# Patient Record
Sex: Female | Born: 1995 | Race: Black or African American | Hispanic: No | Marital: Single | Smoking: Former smoker
Health system: Southern US, Community
[De-identification: ages and names within clinical notes are randomized; demographics above are authoritative.]

## PROBLEM LIST (undated history)

## (undated) ENCOUNTER — Inpatient Hospital Stay (HOSPITAL_COMMUNITY): Payer: Self-pay

## (undated) DIAGNOSIS — Z789 Other specified health status: Secondary | ICD-10-CM

## (undated) DIAGNOSIS — J302 Other seasonal allergic rhinitis: Secondary | ICD-10-CM

## (undated) HISTORY — PX: NO PAST SURGERIES: SHX2092

---
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
## 2017-12-27 ENCOUNTER — Emergency Department (HOSPITAL_COMMUNITY)
Admission: EM | Admit: 2017-12-27 | Discharge: 2017-12-27 | Disposition: A | Discharge status: Home / Self Care | Payer: Medicaid Other | Attending: Emergency Medicine | Admitting: Emergency Medicine

## 2017-12-27 ENCOUNTER — Encounter (HOSPITAL_COMMUNITY): Payer: Self-pay | Admitting: Emergency Medicine

## 2017-12-27 ENCOUNTER — Other Ambulatory Visit: Payer: Self-pay

## 2017-12-27 DIAGNOSIS — Z711 Person with feared health complaint in whom no diagnosis is made: Secondary | ICD-10-CM

## 2017-12-27 DIAGNOSIS — R300 Dysuria: Secondary | ICD-10-CM | POA: Diagnosis present

## 2017-12-27 DIAGNOSIS — N3000 Acute cystitis without hematuria: Secondary | ICD-10-CM | POA: Diagnosis not present

## 2017-12-27 DIAGNOSIS — Z79899 Other long term (current) drug therapy: Secondary | ICD-10-CM | POA: Insufficient documentation

## 2017-12-27 DIAGNOSIS — Z87891 Personal history of nicotine dependence: Secondary | ICD-10-CM | POA: Insufficient documentation

## 2017-12-27 LAB — URINALYSIS, ROUTINE W REFLEX MICROSCOPIC
Bilirubin Urine: NEGATIVE
GLUCOSE, UA: NEGATIVE mg/dL
KETONES UR: 20 mg/dL — AB
Nitrite: POSITIVE — AB
PROTEIN: NEGATIVE mg/dL
Specific Gravity, Urine: 1.023 (ref 1.005–1.030)
pH: 6 (ref 5.0–8.0)

## 2017-12-27 LAB — COMPREHENSIVE METABOLIC PANEL WITH GFR
ALT: 22 U/L (ref 0–44)
AST: 20 U/L (ref 15–41)
Albumin: 4.2 g/dL (ref 3.5–5.0)
Alkaline Phosphatase: 64 U/L (ref 38–126)
Anion gap: 9 (ref 5–15)
BUN: 9 mg/dL (ref 6–20)
CO2: 23 mmol/L (ref 22–32)
Calcium: 9.4 mg/dL (ref 8.9–10.3)
Chloride: 105 mmol/L (ref 98–111)
Creatinine, Ser: 0.94 mg/dL (ref 0.44–1.00)
GFR calc Af Amer: >60 mL/min
GFR calc non Af Amer: >60 mL/min
Glucose, Bld: 90 mg/dL (ref 70–99)
Potassium: 3.5 mmol/L (ref 3.5–5.1)
Sodium: 137 mmol/L (ref 135–145)
Total Bilirubin: 1 mg/dL (ref 0.3–1.2)
Total Protein: 7.2 g/dL (ref 6.5–8.1)

## 2017-12-27 LAB — WET PREP, GENITAL
Clue Cells Wet Prep HPF POC: NONE SEEN
Sperm: NONE SEEN
Trich, Wet Prep: NONE SEEN
Yeast Wet Prep HPF POC: NONE SEEN

## 2017-12-27 LAB — CBC
HCT: 40 % (ref 36.0–46.0)
Hemoglobin: 12.7 g/dL (ref 12.0–15.0)
MCH: 28.9 pg (ref 26.0–34.0)
MCHC: 31.8 g/dL (ref 30.0–36.0)
MCV: 90.9 fL (ref 80.0–100.0)
Platelets: 219 10*3/uL (ref 150–400)
RBC: 4.4 MIL/uL (ref 3.87–5.11)
RDW: 13.7 % (ref 11.5–15.5)
WBC: 10.6 10*3/uL — ABNORMAL HIGH (ref 4.0–10.5)
nRBC: 0 % (ref 0.0–0.2)

## 2017-12-27 LAB — I-STAT BETA HCG BLOOD, ED (MC, WL, AP ONLY): I-stat hCG, quantitative: <5 m[IU]/mL (ref <5)

## 2017-12-27 LAB — LIPASE, BLOOD: Lipase: 21 U/L (ref 11–51)

## 2017-12-27 MED ORDER — CEFTRIAXONE SODIUM 250 MG IJ SOLR
250.0000 mg | Freq: Once | INTRAMUSCULAR | Status: AC
  Administered 2017-12-27: 250 mg via INTRAMUSCULAR
  Filled 2017-12-27: qty 250

## 2017-12-27 MED ORDER — AZITHROMYCIN 250 MG PO TABS
1000.0000 mg | ORAL_TABLET | Freq: Once | ORAL | Status: AC
  Administered 2017-12-27: 1000 mg via ORAL
  Filled 2017-12-27: qty 4

## 2017-12-27 MED ORDER — ONDANSETRON HCL 4 MG PO TABS: 4.0000 mg | ORAL_TABLET | Freq: Four times a day (QID) | ORAL | 0 refills | Status: DC

## 2017-12-27 MED ORDER — CEPHALEXIN 500 MG PO CAPS: 500.0000 mg | ORAL_CAPSULE | Freq: Two times a day (BID) | ORAL | 0 refills | Status: DC

## 2017-12-27 MED ORDER — CEPHALEXIN 500 MG PO CAPS: 500.0000 mg | ORAL_CAPSULE | Freq: Two times a day (BID) | ORAL | 0 refills | Status: AC

## 2017-12-27 NOTE — ED Notes (Addendum)
Light green, lavender, and dark green labs drawn. Pt instructed to provide urine sample when possible. Given UA cup / verbalized understanding.  Dark green is on rocker in mini lab. The rest are in the "Main Lab" at [facility] nursing station tray awaiting orders.

## 2017-12-27 NOTE — ED Provider Notes (Signed)
[facility]   CSN: [id number] Arrival date & time: 12/27/17  1546     History   Chief Complaint Chief Complaint  Patient presents with  . Abdominal Pain    HPI Julia Park is a 22 y.o. female.  Patient is a 22 year old otherwise healthy female presenting today with 1 week of dysuria, frequency and intermittent lower abdominal discomfort.  She denies any vaginal discharge, itching or burning in the vaginal area.  She is currently sexually active with one partner but has only been with this partner for the last few weeks.  She has not been using any protection.  Prior to that she had another partner approximately 1 month ago.  She has Nexplanon but last period was 3 weeks ago.  She denies any back pain, fever, nausea, vomiting.  She has no abdominal pain at this time.  She states she had some cramping earlier this week but it resolved spontaneously.  The history is provided by the patient.  Abdominal Pain   This is a new problem. Episode onset: 1 week. The problem occurs every several days. The problem has not changed since onset.The pain is associated with an unknown factor. The pain is located in the suprapubic region. The quality of the pain is cramping. The pain is at a severity of 0/10. The patient is experiencing no pain. Associated symptoms include dysuria and frequency. Pertinent negatives include anorexia, fever, diarrhea, nausea, vomiting and hematuria. The symptoms are aggravated by urination. Nothing relieves the symptoms.    Past Medical History:  Diagnosis Date  . Medical history non-contributory   . Seasonal allergies     Patient Active Problem List   Diagnosis Date Noted  . Nexplanon insertion 09/15/2015  . Post term pregnancy 09/13/2015    Past Surgical History:  Procedure Laterality Date  . NO PAST SURGERIES       OB History    Gravida  1   Para  1   Term  1   Preterm  0   AB  0   Living  1       SAB  0   TAB  0   Ectopic  0   Multiple  0   Live Births  1            Home Medications    Prior to Admission medications   Medication Sig Start Date End Date Taking? Authorizing Provider  cyclobenzaprine (FLEXERIL) 5 MG tablet Take 1 tablet (5 mg total) by mouth 3 (three) times daily. 06/28/16   Earley FavorSchulz, Gail, NP  ibuprofen (ADVIL,MOTRIN) 600 MG tablet Take 1 tablet (600 mg total) by mouth every 6 (six) hours. 09/16/15   Montez MoritaLawson, Marie D, CNM  ibuprofen (ADVIL,MOTRIN) 600 MG tablet Take 1 tablet (600 mg total) by mouth every 6 (six) hours as needed. 06/28/16   Earley FavorSchulz, Gail, NP  Prenatal Vit-Fe Fumarate-FA (PRENATAL VITAMINS PLUS) 27-1 MG TABS Take 1 tablet by mouth daily. 01/04/15   Bertram Denvereague Clark, Karen E, PA-C    Family History No family history on file.  Social History Social History   Tobacco Use  . Smoking status: Former Smoker    Packs/day: 0.50    Types: Cigars, Cigarettes    Last attempt to quit: 06/10/2015    Years since quitting: 2.5  . Smokeless tobacco: Never Used  Substance Use Topics  . Alcohol use: No  . Drug use: Yes    Types: Marijuana  Comment: used a few months ago     Allergies   Patient has no known allergies.   Review of Systems Review of Systems  Constitutional: Negative for fever.  Gastrointestinal: Positive for abdominal pain. Negative for anorexia, diarrhea, nausea and vomiting.  Genitourinary: Positive for dysuria and frequency. Negative for hematuria.  All other systems reviewed and are negative.    Physical Exam Updated Vital Signs BP 138/88 (BP Location: Right Arm)   Pulse 84   Temp 98.2 F (36.8 C) (Oral)   Resp 16   SpO2 100%   Physical Exam  Constitutional: She is oriented to person, place, and time. She appears well-developed and well-nourished. No distress.  HENT:  Head: Normocephalic and atraumatic.  Mouth/Throat: Oropharynx is clear and moist.  Eyes: Pupils are equal, round, and reactive to light.  Conjunctivae and EOM are normal.  Neck: Normal range of motion. Neck supple.  Cardiovascular: Normal rate, regular rhythm and intact distal pulses.  No murmur heard. Pulmonary/Chest: Effort normal and breath sounds normal. No respiratory distress. She has no wheezes. She has no rales.  Abdominal: Soft. She exhibits no distension. There is no tenderness. There is no rebound and no guarding.  Genitourinary: Vagina normal and uterus normal. Cervix exhibits no motion tenderness and no discharge. Right adnexum displays no mass and no tenderness. Left adnexum displays no mass and no tenderness. No vaginal discharge found.  Musculoskeletal: Normal range of motion. She exhibits no edema or tenderness.  Neurological: She is alert and oriented to person, place, and time.  Skin: Skin is warm and dry. No rash noted. No erythema.  Psychiatric: She has a normal mood and affect. Her behavior is normal.  Nursing note and vitals reviewed.    ED Treatments / Results  Labs (all labs ordered are listed, but only abnormal results are displayed) Labs Reviewed  CBC - Abnormal; Notable for the following components:      Result Value   WBC 10.6 (*)    All other components within normal limits  WET PREP, GENITAL  LIPASE, BLOOD  COMPREHENSIVE METABOLIC PANEL  URINALYSIS, ROUTINE W REFLEX MICROSCOPIC  HIV ANTIBODY (ROUTINE TESTING W REFLEX)  RPR  I-STAT BETA HCG BLOOD, ED ([facility], [facility], [facility] ONLY)  GC/CHLAMYDIA PROBE AMP ([place]) NOT AT [facility]    EKG None  Radiology No results found.  Procedures Procedures (including critical care time)  Medications Ordered in ED Medications - No data to display   Initial Impression / Assessment and Plan / ED Course  I have reviewed the triage vital signs and the nursing notes.  Pertinent labs & imaging results that were available during my care of the patient were reviewed by me and considered in my medical decision making (see chart for details).     Patient is  a healthy 22 year old female who does not practice safe sex practices presenting with urinary symptoms most likely a UTI.  She has no complicating features concerning for pyelonephritis or kidney stone.  However patient also is requesting to be evaluated for an STI as she has recently started having sex with a new partner and does not use protection.  Exam is within normal limits.  Pelvic exam and wet prep, GC chlamydia pending.  Labs drawn prior to my evaluation which showed a mild leukocytosis of 10,000, negative hCG, lipase and CMP are pending.  UA pending.  7:10 PM Labs without acute findings.  Wet prep and UA pending.  Pt desires STI treatment just in case and  was given rocephin and azithro.  Final Clinical Impressions(s) / ED Diagnoses   Final diagnoses:  None    ED Discharge Orders    None       Gwyneth Sprout, MD 12/27/17 1911

## 2017-12-27 NOTE — Discharge Instructions (Signed)
Thank you for allowing me to care for you today in the Emergency Department.   Take 1 tablet of Keflex 2 times daily for the next 5 days.  Do not stop taking this antibiotic even if your symptoms resolve before this time.  Your urine has been sent for culture to the lab.  You can take 1 tablet of Zofran every 6 hours as needed for nausea and vomiting.  You can take 650 mg of Tylenol every 6 hours or 600 mg of ibuprofen with food every 6 hours for pain.  You can always follow up with the health department if you ever need an STD check if you are not having symptoms.  Return to the emergency department if you develop high fever, uncontrollable abdominal pain, significant blood in your urine especially if you are taking antibiotics, uncontrollable vomiting, or other new, concerning symptoms.

## 2017-12-27 NOTE — ED Notes (Signed)
Per lab urine needs to be recollected due to label issue. RN notified.

## 2017-12-27 NOTE — ED Triage Notes (Signed)
Lower abd pain x 1 week states her urine smell s funny and she is irritated when she pees

## 2017-12-27 NOTE — ED Notes (Signed)
Pt stable, ambulatory, states understanding of discharge instructions 

## 2017-12-27 NOTE — ED Provider Notes (Signed)
22 year old Julia Park received a signout from Dr. Anitra LauthPlunkett pending urinalysis.  Per her HPI:  "HPI Julia Julia Park is a 22 y.o. Julia Park.  Patient is a 22 year old otherwise healthy Julia Park presenting today with 1 week of dysuria, frequency and intermittent lower abdominal discomfort.  She denies any vaginal discharge, itching or burning in the vaginal area.  She is currently sexually active with one partner but has only been with this partner for the last few weeks.  She has not been using any protection.  Prior to that she had another partner approximately 1 month ago.  She has Nexplanon but last period was 3 weeks ago.  She denies any back pain, fever, nausea, vomiting.  She has no abdominal pain at this time.  She states she had some cramping earlier this week but it resolved spontaneously.  The history is provided by the patient.  Abdominal Pain   This is a new problem. Episode onset: 1 week. The problem occurs every several days. The problem has not changed since onset.The pain is associated with an unknown factor. The pain is located in the suprapubic region. The quality of the pain is cramping. The pain is at a severity of 0/10. The patient is experiencing no pain. Associated symptoms include dysuria and frequency. Pertinent negatives include anorexia, fever, diarrhea, nausea, vomiting and hematuria. The symptoms are aggravated by urination. Nothing relieves the symptoms. "  Physical Exam  BP 131/85   Pulse 80   Temp 98.4 F (36.9 C) (Oral)   Resp 17   SpO2 100%   Physical Exam  Minimally tender to palpation in the bilateral lower quadrants.  No rebound or guarding.  Abdomen is soft, nondistended.  Normoactive bowel sounds.  ED Course/Procedures     Procedures  MDM   22 year old Julia Park received a signout from Dr. Anitra LauthPlunkett pending urinalysis results.  She has been treated with Rocephin and azithromycin due to concern for STD exposure.  UA is concerning for infection.  Urine culture has  been sent.  No previous cultures available for comparison.  We will discharge the patient to home with Zofran for nausea and Keflex for UTI.  Recommended follow-up with PCP for recheck this week.  She is hemodynamically stable and in no acute distress.  She is safe for discharge to home with outpatient follow-up at this time.    Frederik PearMcDonald, Jaretzy Lhommedieu A, PA-C 12/27/17 2141    Gwyneth SproutPlunkett, Whitney, MD 12/28/17 2100

## 2017-12-28 LAB — HIV ANTIBODY (ROUTINE TESTING W REFLEX): HIV Screen 4th Generation wRfx: NONREACTIVE

## 2017-12-28 LAB — GC/CHLAMYDIA PROBE AMP (~~LOC~~) NOT AT ARMC
CHLAMYDIA, DNA PROBE: NEGATIVE
Neisseria Gonorrhea: NEGATIVE

## 2017-12-28 LAB — RPR: RPR: NONREACTIVE

## 2018-01-18 ENCOUNTER — Emergency Department (HOSPITAL_COMMUNITY)
Admission: EM | Admit: 2018-01-18 | Discharge: 2018-01-18 | Disposition: A | Discharge status: Home / Self Care | Payer: Medicaid Other | Attending: Emergency Medicine | Admitting: Emergency Medicine

## 2018-01-18 ENCOUNTER — Emergency Department (HOSPITAL_COMMUNITY): Payer: Medicaid Other

## 2018-01-18 ENCOUNTER — Encounter (HOSPITAL_COMMUNITY): Payer: Self-pay | Admitting: Pharmacy Technician

## 2018-01-18 ENCOUNTER — Other Ambulatory Visit: Payer: Self-pay

## 2018-01-18 DIAGNOSIS — Y929 Unspecified place or not applicable: Secondary | ICD-10-CM | POA: Diagnosis not present

## 2018-01-18 DIAGNOSIS — S62316A Displaced fracture of base of fifth metacarpal bone, right hand, initial encounter for closed fracture: Secondary | ICD-10-CM | POA: Diagnosis not present

## 2018-01-18 DIAGNOSIS — W2209XA Striking against other stationary object, initial encounter: Secondary | ICD-10-CM | POA: Insufficient documentation

## 2018-01-18 DIAGNOSIS — Y999 Unspecified external cause status: Secondary | ICD-10-CM | POA: Insufficient documentation

## 2018-01-18 DIAGNOSIS — Z87891 Personal history of nicotine dependence: Secondary | ICD-10-CM | POA: Diagnosis not present

## 2018-01-18 DIAGNOSIS — Y939 Activity, unspecified: Secondary | ICD-10-CM | POA: Diagnosis not present

## 2018-01-18 DIAGNOSIS — S6991XA Unspecified injury of right wrist, hand and finger(s), initial encounter: Secondary | ICD-10-CM | POA: Diagnosis present

## 2018-01-18 NOTE — ED Provider Notes (Signed)
[facility] Provider Note   CSN: [id number] Arrival date & time: 01/18/18  2013     History   Chief Complaint Chief Complaint  Patient presents with  . Hand Injury    HPI Julia Park is a 22 y.o. female.  HPI  Patient is a [age] year old female with a history of seasonal allergies who presents emergency department today for evaluation of right hand pain that began several days ago after she punched a wall.  Patient states that time she had sudden onset of pain to the right hand.  States she has tried ice and elevation as well as over-the-counter medications with no significant relief at home.  Reports swelling to the area and numbness as well.  Has decreased range of motion secondary to pain.  Describes pain as aching pain.  Estimated by movement.  Past Medical History:  Diagnosis Date  . Medical history non-contributory   . Seasonal allergies     Patient Active Problem List   Diagnosis Date Noted  . Nexplanon insertion 09/15/2015  . Post term pregnancy 09/13/2015    Past Surgical History:  Procedure Laterality Date  . NO PAST SURGERIES       OB History    Gravida  1   Para  1   Term  1   Preterm  0   AB  0   Living  1     SAB  0   TAB  0   Ectopic  0   Multiple  0   Live Births  1            Home Medications    Prior to Admission medications   Medication Sig Start Date End Date Taking? Authorizing Provider  acetaminophen (TYLENOL) 500 MG tablet Take 500-1,000 mg by mouth every 6 (six) hours as needed (for pain).   Yes [provider]  ibuprofen (ADVIL,MOTRIN) 200 MG tablet Take 200-600 mg by mouth every 6 (six) hours as needed (for pain).   Yes [provider]  cyclobenzaprine (FLEXERIL) 5 MG tablet Take 1 tablet (5 mg total) by mouth 3 (three) times daily. Patient not taking: Reported on 01/18/2018 06/28/16   Earley FavorSchulz, Gail, NP  ibuprofen (ADVIL,MOTRIN) 600 MG tablet Take 1 tablet (600  mg total) by mouth every 6 (six) hours. Patient not taking: Reported on 01/18/2018 09/16/15   Montez MoritaLawson, Marie D, CNM  ibuprofen (ADVIL,MOTRIN) 600 MG tablet Take 1 tablet (600 mg total) by mouth every 6 (six) hours as needed. Patient not taking: Reported on 01/18/2018 06/28/16   Earley FavorSchulz, Gail, NP  ondansetron (ZOFRAN) 4 MG tablet Take 1 tablet (4 mg total) by mouth every 6 (six) hours. Patient not taking: Reported on 01/18/2018 12/27/17   McDonald, Pedro EarlsMia A, PA-C  Prenatal Vit-Fe Fumarate-FA (PRENATAL VITAMINS PLUS) 27-1 MG TABS Take 1 tablet by mouth daily. Patient not taking: Reported on 01/18/2018 01/04/15   Glyn Adeeague Clark, Scot JunKaren E, PA-C    Family History No family history on file.  Social History Social History   Tobacco Use  . Smoking status: Former Smoker    Packs/day: 0.50    Types: Cigars, Cigarettes    Last attempt to quit: 06/10/2015    Years since quitting: 2.6  . Smokeless tobacco: Never Used  Substance Use Topics  . Alcohol use: No  . Drug use: Yes    Types: Marijuana    Comment: used a few months ago     Allergies   Patient has  no known allergies.   Review of Systems Review of Systems  Constitutional: Negative for fever.  Genitourinary: Negative for pelvic pain.  Musculoskeletal:       Right hand pain  Skin: Negative for wound.  Neurological: Positive for numbness.     Physical Exam Updated Vital Signs BP (!) 144/128 (BP Location: Left Arm)   Pulse 95   Temp 99.3 F (37.4 C) (Oral)   Resp 16   Ht 5\' 2"  (1.575 m)   Wt 74.8 kg   LMP 01/08/2018   SpO2 100%   BMI 30.18 kg/m   Physical Exam Vitals signs and nursing note reviewed.  Constitutional:      General: She is not in acute distress.    Appearance: She is well-developed.  HENT:     Head: Normocephalic and atraumatic.  Eyes:     Conjunctiva/sclera: Conjunctivae normal.  Neck:     Musculoskeletal: Neck supple.  Cardiovascular:     Rate and Rhythm: Normal rate.  Pulmonary:     Effort: Pulmonary  effort is normal.  Musculoskeletal:     Comments: Tenderness and swelling over the right fourth and fifth metacarpals.  Decreased grip strength on the right secondary to pain.  Neurovascularly intact distally.  No tenderness to the radius, ulna or carpal bones.  Full range of motion at the wrist and elbow.  Skin:    General: Skin is warm and dry.  Neurological:     Mental Status: She is alert.      ED Treatments / Results  Labs (all labs ordered are listed, but only abnormal results are displayed) Labs Reviewed - No data to display  EKG None  Radiology Dg Hand Complete Right  Result Date: 01/18/2018 CLINICAL DATA:  Punched wall 3 days ago. Persistent right hand pain and swelling. Initial encounter. EXAM: RIGHT HAND - COMPLETE 3+ VIEW COMPARISON:  None. FINDINGS: A comminuted, mildly displaced fracture seen involving base 5th metacarpal. No other fractures or bone lesions identified. IMPRESSION: Mildly displaced and comminuted fracture of the 5th metacarpal base. Electronically Signed   By: Myles Rosenthal M.D.   On: 01/18/2018 21:33    Procedures Procedures (including critical care time) SPLINT APPLICATION Date/Time: 10:06 PM Authorized by: Karrie Meres Consent: Verbal consent obtained. Risks and benefits: risks, benefits and alternatives were discussed Consent given by: patient Splint applied by: orthopedic technician Location details: RUE Splint type: dorsal and volar forearm splint Post-procedure: The splinted body part was neurovascularly unchanged following the procedure. Patient tolerance: Patient tolerated the procedure well with no immediate complications.   Medications Ordered in ED Medications - No data to display   Initial Impression / Assessment and Plan / ED Course  I have reviewed the triage vital signs and the nursing notes.  Pertinent labs & imaging results that were available during my care of the patient were reviewed by me and considered in my  medical decision making (see chart for details).     Final Clinical Impressions(s) / ED Diagnoses   Final diagnoses:  Displaced fracture of base of fifth metacarpal bone, right hand, initial encounter for closed fracture   Patient presenting with right hand pain after punching a wall several days ago.  X-ray of the right hand shows Mildly displaced and comminuted fracture of the 5th metacarpal base. Splint applied in the ED. follow-up with hand surgery given.  Advised Tylenol and Motrin for symptoms.  Advised elevation.  Advised to return to the ER for new or worsening symptoms.  She voiced understanding of the plan and reasons return.  All questions answered.  ED Discharge Orders    None       Rayne DuCouture, Shanquita Ronning S, PA-C 01/18/18 2206    Tegeler, Canary Brimhristopher J, MD 01/19/18 [phone]

## 2018-01-18 NOTE — Progress Notes (Signed)
Orthopedic Tech Progress Note Patient Details:  Julia Park 02/05/1995 [id number]  Ortho Devices Type of Ortho Device: Short arm splint Ortho Device/Splint Interventions: Ordered, Application   Post Interventions Patient Tolerated: Well Instructions Provided: Care of device, Adjustment of device   Norva KarvonenWalls, Julia Park 01/18/2018, 10:07 PM

## 2018-01-18 NOTE — ED Notes (Signed)
Patient verbalizes understanding of discharge instructions. Opportunity for questioning and answers were provided. 

## 2018-01-18 NOTE — ED Notes (Signed)
Pt returned from xray

## 2018-01-18 NOTE — Discharge Instructions (Addendum)
You may alternate taking Tylenol and Ibuprofen as needed for pain control. You may take 400-600 mg of ibuprofen every 6 hours and [id number] mg of Tylenol every 6 hours. Do not exceed 4000 mg of Tylenol daily as this can lead to liver damage. Also, make sure to take Ibuprofen with meals as it can cause an upset stomach. Do not take other NSAIDs while taking Ibuprofen such as (Aleve, Naprosyn, Aspirin, Celebrex, etc) and do not take more than the prescribed dose as this can lead to ulcers and bleeding in your GI tract. You may use warm and cold compresses to help with your symptoms.   Please wear the splint at all times and follow the instructions on your discharge paperwork in regards to caring for your splint.  Please follow up with your primary doctor within the next 7-10 days for re-evaluation and further treatment of your symptoms.   Please return to the ER sooner if you have any new or worsening symptoms.

## 2018-01-18 NOTE — ED Triage Notes (Signed)
Pt presents to the ED with R hand pain and bruising after punching a wall 2 days ago. Reports along the outward edge of the 5th digit there is numbness.

## 2018-10-04 ENCOUNTER — Other Ambulatory Visit: Payer: Self-pay

## 2018-10-04 ENCOUNTER — Encounter (HOSPITAL_COMMUNITY): Payer: Self-pay | Admitting: Emergency Medicine

## 2018-10-04 ENCOUNTER — Emergency Department (HOSPITAL_COMMUNITY)
Admission: EM | Admit: 2018-10-04 | Discharge: 2018-10-04 | Disposition: A | Discharge status: Home / Self Care | Payer: Medicaid Other | Attending: Emergency Medicine | Admitting: Emergency Medicine

## 2018-10-04 DIAGNOSIS — Z113 Encounter for screening for infections with a predominantly sexual mode of transmission: Secondary | ICD-10-CM | POA: Diagnosis not present

## 2018-10-04 DIAGNOSIS — Z87891 Personal history of nicotine dependence: Secondary | ICD-10-CM | POA: Diagnosis not present

## 2018-10-04 DIAGNOSIS — R197 Diarrhea, unspecified: Secondary | ICD-10-CM | POA: Diagnosis not present

## 2018-10-04 DIAGNOSIS — N898 Other specified noninflammatory disorders of vagina: Secondary | ICD-10-CM | POA: Diagnosis not present

## 2018-10-04 DIAGNOSIS — Z20828 Contact with and (suspected) exposure to other viral communicable diseases: Secondary | ICD-10-CM | POA: Insufficient documentation

## 2018-10-04 DIAGNOSIS — R1031 Right lower quadrant pain: Secondary | ICD-10-CM | POA: Insufficient documentation

## 2018-10-04 DIAGNOSIS — R1032 Left lower quadrant pain: Secondary | ICD-10-CM | POA: Insufficient documentation

## 2018-10-04 DIAGNOSIS — R1030 Lower abdominal pain, unspecified: Secondary | ICD-10-CM

## 2018-10-04 LAB — URINALYSIS, ROUTINE W REFLEX MICROSCOPIC
Bacteria, UA: NONE SEEN
Bilirubin Urine: NEGATIVE
Glucose, UA: NEGATIVE mg/dL
Ketones, ur: 20 mg/dL — AB
Nitrite: NEGATIVE
Protein, ur: NEGATIVE mg/dL
Specific Gravity, Urine: 1.026 (ref 1.005–1.030)
pH: 5 (ref 5.0–8.0)

## 2018-10-04 LAB — CBC
HCT: 36.1 % (ref 36.0–46.0)
Hemoglobin: 11.9 g/dL — ABNORMAL LOW (ref 12.0–15.0)
MCH: 29.8 pg (ref 26.0–34.0)
MCHC: 33 g/dL (ref 30.0–36.0)
MCV: 90.3 fL (ref 80.0–100.0)
Platelets: 196 10*3/uL (ref 150–400)
RBC: 4 MIL/uL (ref 3.87–5.11)
RDW: 13.4 % (ref 11.5–15.5)
WBC: 16.6 10*3/uL — ABNORMAL HIGH (ref 4.0–10.5)
nRBC: 0 % (ref 0.0–0.2)

## 2018-10-04 LAB — WET PREP, GENITAL
Clue Cells Wet Prep HPF POC: NONE SEEN
Sperm: NONE SEEN
Trich, Wet Prep: NONE SEEN
Yeast Wet Prep HPF POC: NONE SEEN

## 2018-10-04 LAB — COMPREHENSIVE METABOLIC PANEL
ALT: 18 U/L (ref 0–44)
AST: 14 U/L — ABNORMAL LOW (ref 15–41)
Albumin: 4.1 g/dL (ref 3.5–5.0)
Alkaline Phosphatase: 57 U/L (ref 38–126)
Anion gap: 9 (ref 5–15)
BUN: 15 mg/dL (ref 6–20)
CO2: 22 mmol/L (ref 22–32)
Calcium: 9.3 mg/dL (ref 8.9–10.3)
Chloride: 106 mmol/L (ref 98–111)
Creatinine, Ser: 0.93 mg/dL (ref 0.44–1.00)
GFR calc Af Amer: >60 mL/min (ref >60)
GFR calc non Af Amer: >60 mL/min (ref >60)
Glucose, Bld: 98 mg/dL (ref 70–99)
Potassium: 3.3 mmol/L — ABNORMAL LOW (ref 3.5–5.1)
Sodium: 137 mmol/L (ref 135–145)
Total Bilirubin: 1.5 mg/dL — ABNORMAL HIGH (ref 0.3–1.2)
Total Protein: 7.3 g/dL (ref 6.5–8.1)

## 2018-10-04 LAB — I-STAT BETA HCG BLOOD, ED (MC, WL, AP ONLY): I-stat hCG, quantitative: <5 m[IU]/mL (ref <5)

## 2018-10-04 LAB — LIPASE, BLOOD: Lipase: 18 U/L (ref 11–51)

## 2018-10-04 MED ORDER — SODIUM CHLORIDE 0.9% FLUSH: 3.0000 mL | Freq: Once | INTRAVENOUS | Status: DC

## 2018-10-04 MED ORDER — KETOROLAC TROMETHAMINE 60 MG/2ML IM SOLN
60.0000 mg | Freq: Once | INTRAMUSCULAR | Status: AC
  Administered 2018-10-04: 60 mg via INTRAMUSCULAR
  Filled 2018-10-04: qty 2

## 2018-10-04 MED ORDER — AZITHROMYCIN 1 G PO PACK
1.0000 g | PACK | Freq: Once | ORAL | Status: AC
  Administered 2018-10-04: 23:00:00 1 g via ORAL
  Filled 2018-10-04: qty 1

## 2018-10-04 MED ORDER — NAPROXEN 500 MG PO TABS: 500.0000 mg | ORAL_TABLET | Freq: Two times a day (BID) | ORAL | 0 refills | Status: DC

## 2018-10-04 MED ORDER — HYDROCODONE-ACETAMINOPHEN 5-325 MG PO TABS
1.0000 | ORAL_TABLET | Freq: Once | ORAL | Status: AC
  Administered 2018-10-04: 1 via ORAL
  Filled 2018-10-04: qty 1

## 2018-10-04 MED ORDER — CEFTRIAXONE SODIUM 250 MG IJ SOLR
250.0000 mg | Freq: Once | INTRAMUSCULAR | Status: AC
  Administered 2018-10-04: 23:00:00 250 mg via INTRAMUSCULAR
  Filled 2018-10-04: qty 250

## 2018-10-04 MED ORDER — STERILE WATER FOR INJECTION IJ SOLN
INTRAMUSCULAR | Status: AC
  Administered 2018-10-04: 23:00:00 0.9 mL
  Filled 2018-10-04: qty 10

## 2018-10-04 NOTE — Discharge Instructions (Signed)
You were seen today for abdominal pain. We think this may be related to a sexually transmitted infection so you were given antibiotics. We do not have your test results back yet to know for sure. We have also tested you for Covid-19. Please make sure to go home and self quarantine until you know your results are negative. Also, since we do not have your results back yet, please return to the ER if you are having andy new or worsening symptoms. Thank you for allowing me to care for you today. Please return to the emergency department if you have new or worsening symptoms. Take your medications as instructed.

## 2018-10-04 NOTE — ED Triage Notes (Signed)
Pt reports lower abdominal pain since yesterday, denies nausea, vomiting, has had diarrhea both days. Denies recent fever. LMP 10/02/2018. VSS. Pt NAD at present.

## 2018-10-04 NOTE — ED Provider Notes (Signed)
[facility] EMERGENCY DEPARTMENT Provider Note   CSN: [id number] Arrival date & time: 10/04/18  1434     History   Chief Complaint Chief Complaint  Patient presents with  . Abdominal Pain    HPI Julia Park is a 23 y.o. female.     HPI  Past Medical History:  Diagnosis Date  . Medical history non-contributory   . Seasonal allergies     Patient Active Problem List   Diagnosis Date Noted  . Nexplanon insertion 09/15/2015  . Post term pregnancy 09/13/2015    Past Surgical History:  Procedure Laterality Date  . NO PAST SURGERIES       OB History    Gravida  1   Para  1   Term  1   Preterm  0   AB  0   Living  1     SAB  0   TAB  0   Ectopic  0   Multiple  0   Live Births  1            Home Medications    Prior to Admission medications   Medication Sig Start Date End Date Taking? Authorizing Provider  naproxen (NAPROSYN) 500 MG tablet Take 1 tablet (500 mg total) by mouth 2 (two) times daily. 10/04/18   Arlyn Dunning PA-C    Family History History reviewed. No pertinent family history.  Social History Social History   Tobacco Use  . Smoking status: Former Smoker    Packs/day: 0.50    Types: Cigars, Cigarettes    Quit date: 06/10/2015    Years since quitting: 3.3  . Smokeless tobacco: Never Used  Substance Use Topics  . Alcohol use: No  . Drug use: Yes    Types: Marijuana    Comment: used a few months ago     Allergies   Patient has no known allergies.   Review of Systems Review of Systems  Constitutional: Positive for appetite change. Negative for chills and fever.  HENT: Negative for congestion and sore throat.   Respiratory: Negative for cough and shortness of breath.   Gastrointestinal: Positive for abdominal pain and diarrhea. Negative for nausea and vomiting.  Genitourinary: Negative for decreased urine volume, difficulty urinating, dysuria, flank pain, hematuria, vaginal bleeding, vaginal  discharge and vaginal pain.  Musculoskeletal: Negative for back pain.  Skin: Negative for rash.  Neurological: Negative for dizziness.     Physical Exam Updated Vital Signs BP (!) 117/59 (BP Location: Right Arm)   Pulse 60   Temp 99.1 F (37.3 C) (Oral)   Resp 12   Ht 5\' 2"  (1.575 m)   Wt 68 kg   LMP 09/30/2018   SpO2 99%   BMI 27.44 kg/m   Physical Exam Vitals signs and nursing note reviewed. Exam conducted with a chaperone present (chaperoned by ER tech).  Constitutional:      General: She is not in acute distress.    Appearance: She is well-developed. She is not ill-appearing, toxic-appearing or diaphoretic.  Cardiovascular:     Rate and Rhythm: Normal rate.  Pulmonary:     Effort: Pulmonary effort is normal.  Abdominal:     General: Abdomen is flat. Bowel sounds are normal.     Palpations: Abdomen is soft.     Tenderness: There is abdominal tenderness in the right lower quadrant, suprapubic area and left lower quadrant. There is no right CVA tenderness or left CVA tenderness.  Genitourinary:  Vagina: Normal.     Cervix: Normal. Discharge: green.     Uterus: Normal. Not tender.      Adnexa: Right adnexa normal and left adnexa normal.       Right: No tenderness.         Left: No tenderness.    Skin:    General: Skin is warm.  Neurological:     Mental Status: She is alert.  Psychiatric:        Mood and Affect: Mood normal.      ED Treatments / Results  Labs (all labs ordered are listed, but only abnormal results are displayed) Labs Reviewed  WET PREP, GENITAL - Abnormal; Notable for the following components:      Result Value   WBC, Wet Prep HPF POC MANY (*)    All other components within normal limits  COMPREHENSIVE METABOLIC PANEL - Abnormal; Notable for the following components:   Potassium 3.3 (*)    AST 14 (*)    Total Bilirubin 1.5 (*)    All other components within normal limits  CBC - Abnormal; Notable for the following components:   WBC  16.6 (*)    Hemoglobin 11.9 (*)    All other components within normal limits  URINALYSIS, ROUTINE W REFLEX MICROSCOPIC - Abnormal; Notable for the following components:   APPearance HAZY (*)    Hgb urine dipstick SMALL (*)    Ketones, ur 20 (*)    Leukocytes,Ua SMALL (*)    All other components within normal limits  NOVEL CORONAVIRUS, NAA (HOSP ORDER, SEND-OUT TO REF LAB; TAT 18-24 HRS)  LIPASE, BLOOD  I-STAT BETA HCG BLOOD, ED ([facility], [facility], [facility] ONLY)  GC/CHLAMYDIA PROBE AMP ([place]) NOT AT [facility]    EKG None  Radiology No results found.  Procedures Procedures (including critical care time)  Medications Ordered in ED Medications  sodium chloride flush (NS) 0.9 % injection 3 mL (3 mLs Intravenous Not Given 10/04/18 2321)  ketorolac (TORADOL) injection 60 mg (60 mg Intramuscular Given 10/04/18 2216)  cefTRIAXone (ROCEPHIN) injection 250 mg (250 mg Intramuscular Given 10/04/18 2320)  azithromycin (ZITHROMAX) powder 1 g (1 g Oral Given 10/04/18 2320)  sterile water (preservative free) injection (0.9 mLs  Given 10/04/18 2321)  HYDROcodone-acetaminophen (NORCO/VICODIN) 5-325 MG per tablet 1 tablet (1 tablet Oral Given 10/04/18 2324)     Initial Impression / Assessment and Plan / ED Course  I have reviewed the triage vital signs and the nursing notes.  Pertinent labs & imaging results that were available during my care of the patient were reviewed by me and considered in my medical decision making (see chart for details).  Clinical Course as of Oct 04 2331  Tue Oct 04, 2018  2230 Patient was seen and examined by myself in addition to the PA.  Per my history, briefly, the patient is a 23 year old female with no significant past medical history presents emergency department with abdominal pain.  She reports symptom onset gradually yesterday while at work.  She describes it as a with a pressure and a sharp pain in her suprapubic region.  The pain went away completely and then returned insidiously  again today while she was at work.  She currently has mild pain.  She reports she has felt similar symptoms in the past when she was diagnosed with gonorrhea and like to get tested again today.  She reports she is sexually active with her boyfriend and they intermittently use barrier protection.  She denies  any vaginal bleeding or discharge.  She reports her menses ended yesterday.  She denies an abdominal surgical history.  She denies any nausea or vomiting.  She reports some diarrhea.  She also reports some dysuria.  On exam she is well-appearing and comfortable.  On abdominal exam she demonstrates some mild to moderate tenderness in the left lower quadrant suprapubic region.  She has no focal tenderness McBurney's point.  She has a negative psoas and Rovsing sign.  She has a negative Murphy sign.   [MT]  2325 Patient is feeling better. Discussed lab findings with patient and family member at bedside. Disucssed treating her for gonorrhea/chlamydia due to pelvic pain with vaginal discharge. Also discussed cannot r/o appendicitis or other intraabdominal process given her fever and white count. However, since she clinically appears well and would like to go home, I will d/c her with very strict return precautions. Patient has access to mychart and will check her results from there. Advised that she should be feeling better by tomorrow if this is related to gc/chlamydia and that she needs to return to the ER for any worsening or new symptoms.    [KM]    Clinical Course User Index [KM] Alveria Apley, PA-C [MT] Langston Masker Carola Rhine, MD       Counseled pt on good return precautions and encouraged both PCP and ED follow-up as needed.  Prior to discharge, I also discussed incidental imaging findings with patient in detail and advised appropriate, recommended follow-up in detail.  Clinical Impression: 1. Lower abdominal pain     Disposition: Discharge  This note was prepared with assistance of Dragon  voice recognition software. Occasional wrong-word or sound-a-like substitutions may have occurred due to the inherent limitations of voice recognition software.   Final Clinical Impressions(s) / ED Diagnoses   Final diagnoses:  Lower abdominal pain    ED Discharge Orders         Ordered    naproxen (NAPROSYN) 500 MG tablet  2 times daily     10/04/18 2332           Kristine Royal 10/04/18 2333    Wyvonnia Dusky, MD 10/05/18 [phone]

## 2018-10-04 NOTE — ED Notes (Signed)
Patient verbalizes understanding of discharge instructions. Opportunity for questioning and answers were provided. Armband removed by staff, pt discharged from ED ambulatory.   

## 2018-10-06 LAB — NOVEL CORONAVIRUS, NAA (HOSP ORDER, SEND-OUT TO REF LAB; TAT 18-24 HRS): SARS-CoV-2, NAA: NOT DETECTED

## 2018-10-06 LAB — GC/CHLAMYDIA PROBE AMP (~~LOC~~) NOT AT ARMC
Chlamydia: NEGATIVE
Neisseria Gonorrhea: POSITIVE — AB

## 2018-10-06 NOTE — ED Provider Notes (Signed)
Follow up call with patient today.  She reports that her abdominal pain has resolved completely.  She now is having diarrhea, which I explained is common after receiving antibiotics.  No fevers or other infectious symptoms.  She continues to self-quarantine while awaiting her COVID results.   Wyvonnia Dusky, MD 10/06/18 1005

## 2019-04-11 ENCOUNTER — Encounter (HOSPITAL_COMMUNITY): Payer: Self-pay

## 2019-04-11 ENCOUNTER — Other Ambulatory Visit: Payer: Self-pay

## 2019-04-11 ENCOUNTER — Emergency Department (HOSPITAL_COMMUNITY)
Admission: EM | Admit: 2019-04-11 | Discharge: 2019-04-11 | Discharge status: Against Medical Advice | Payer: Medicaid Other | Attending: Emergency Medicine | Admitting: Emergency Medicine

## 2019-04-11 DIAGNOSIS — Z5321 Procedure and treatment not carried out due to patient leaving prior to being seen by health care provider: Secondary | ICD-10-CM | POA: Diagnosis not present

## 2019-04-11 DIAGNOSIS — R102 Pelvic and perineal pain: Secondary | ICD-10-CM | POA: Insufficient documentation

## 2019-04-11 LAB — URINALYSIS, ROUTINE W REFLEX MICROSCOPIC
Bilirubin Urine: NEGATIVE
Glucose, UA: NEGATIVE mg/dL
Hgb urine dipstick: NEGATIVE
Ketones, ur: NEGATIVE mg/dL
Leukocytes,Ua: NEGATIVE
Nitrite: NEGATIVE
Protein, ur: NEGATIVE mg/dL
Specific Gravity, Urine: 1.021 (ref 1.005–1.030)
pH: 7 (ref 5.0–8.0)

## 2019-04-11 LAB — I-STAT BETA HCG BLOOD, ED (MC, WL, AP ONLY): I-stat hCG, quantitative: <5 m[IU]/mL (ref <5)

## 2019-04-11 NOTE — ED Triage Notes (Signed)
Pt reports vaginal discomfort, a bump growing on the inside of her thigh, pain to her Left arm at the site of her [facility] implant and pt requesting to have blood work drawn.

## 2019-04-11 NOTE — ED Notes (Signed)
Pt. has let due to wait time, will attempt to come back in the morning.

## 2019-08-06 IMAGING — CR DG HAND COMPLETE 3+V*R*
3 series · 3 of 3 positions shown · non-contrast
Comparison: None.

CLINICAL DATA: Punched wall 3 days ago. Persistent right hand pain
and swelling. Initial encounter.

EXAM:
RIGHT HAND - COMPLETE 3+ VIEW

[hand pa]
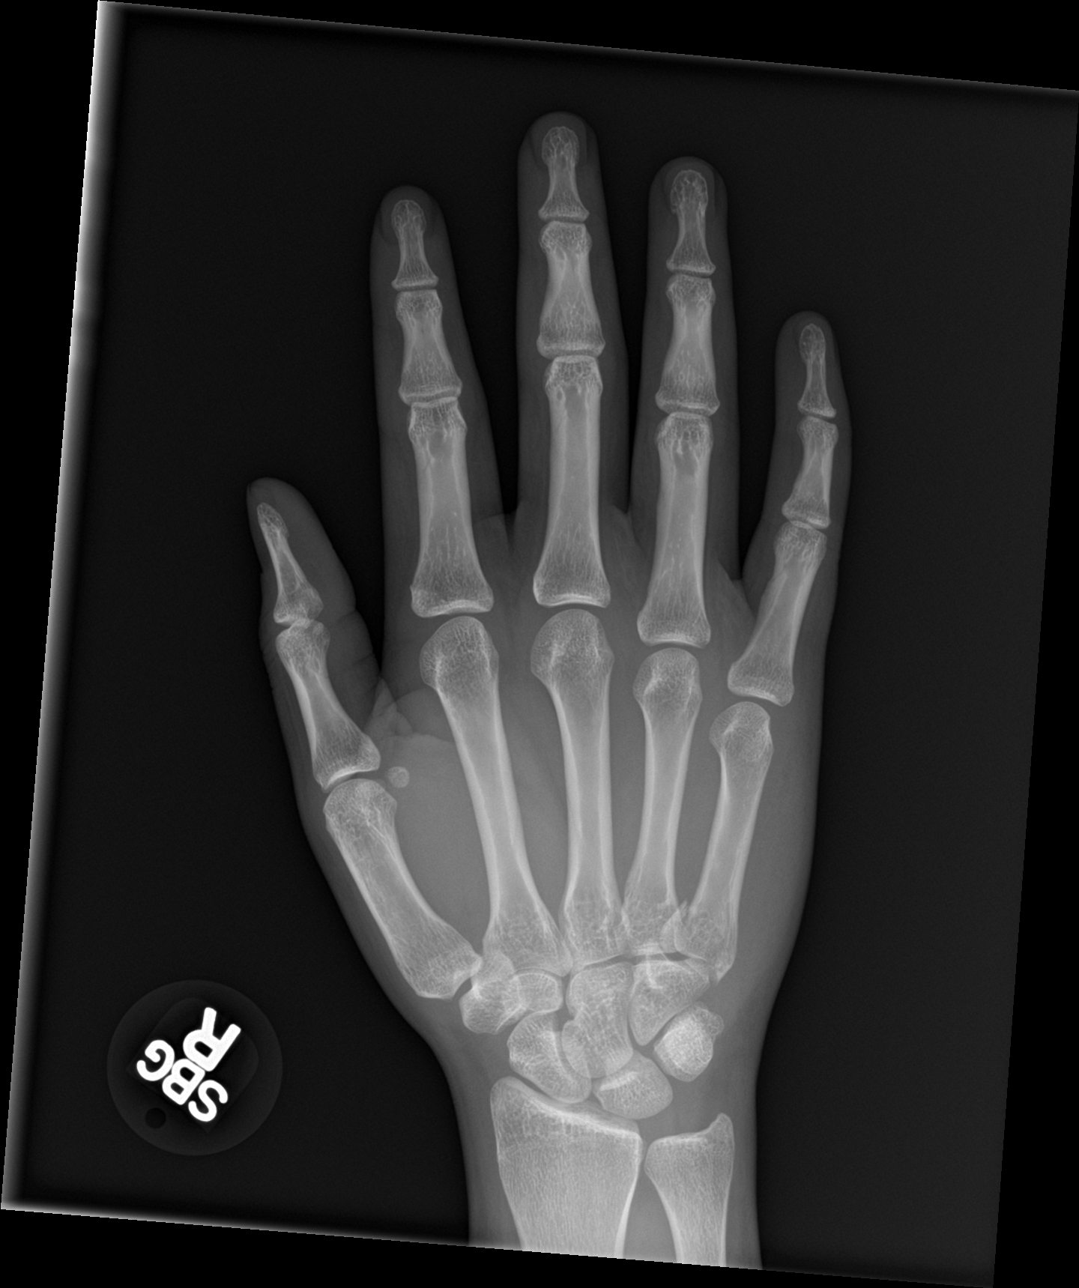

[hand obl]
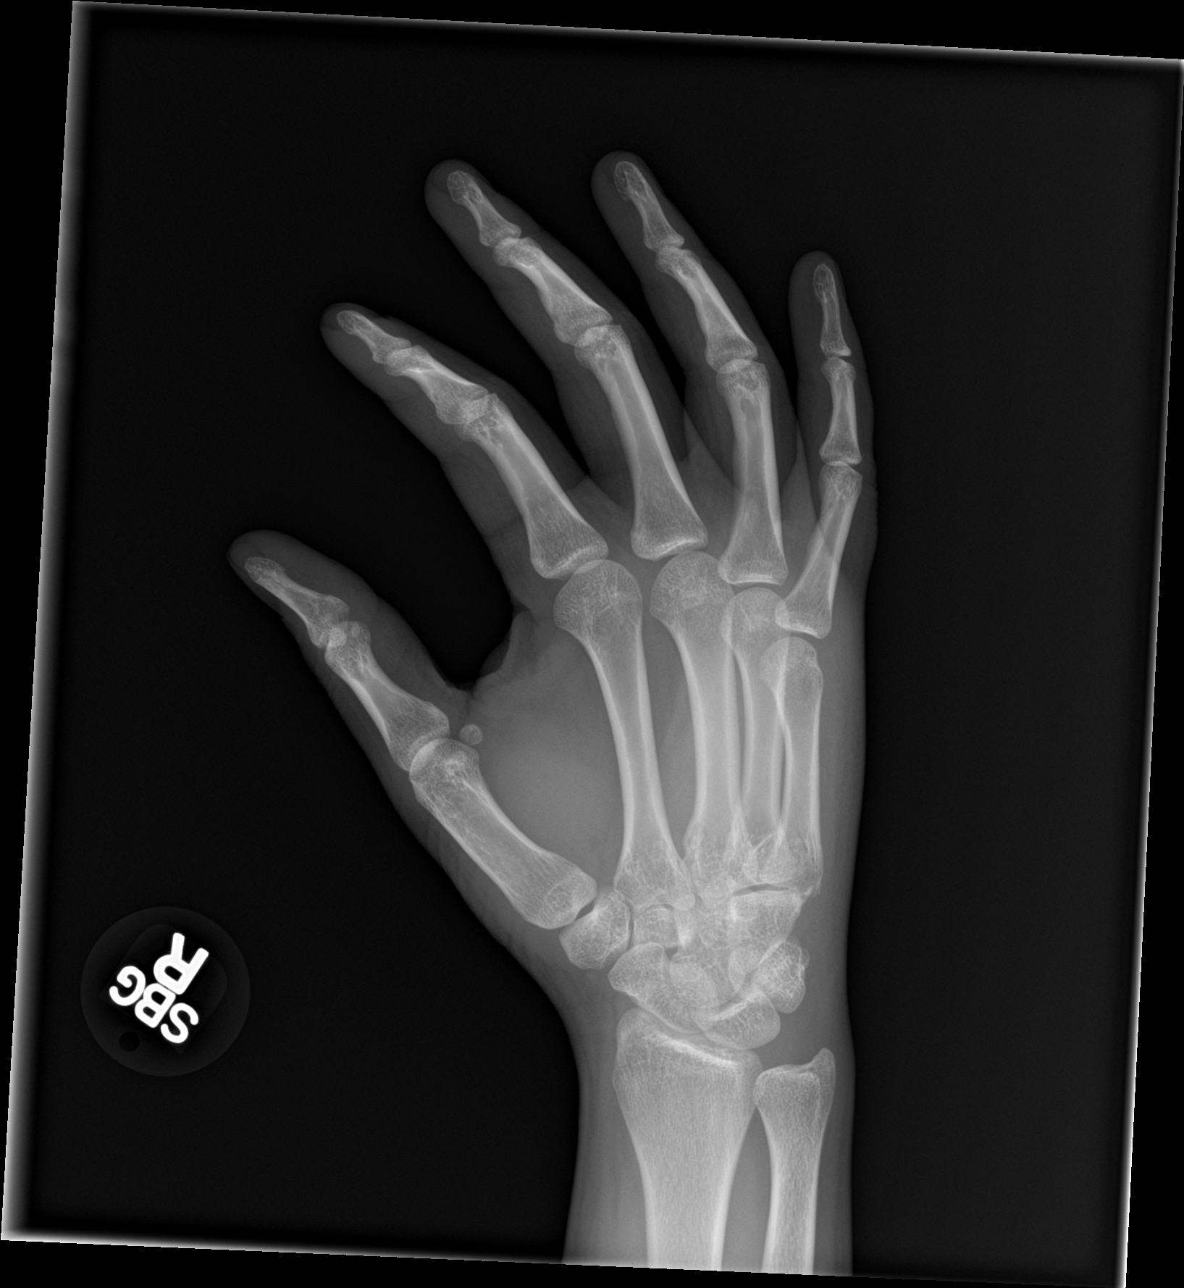

[hand lat]
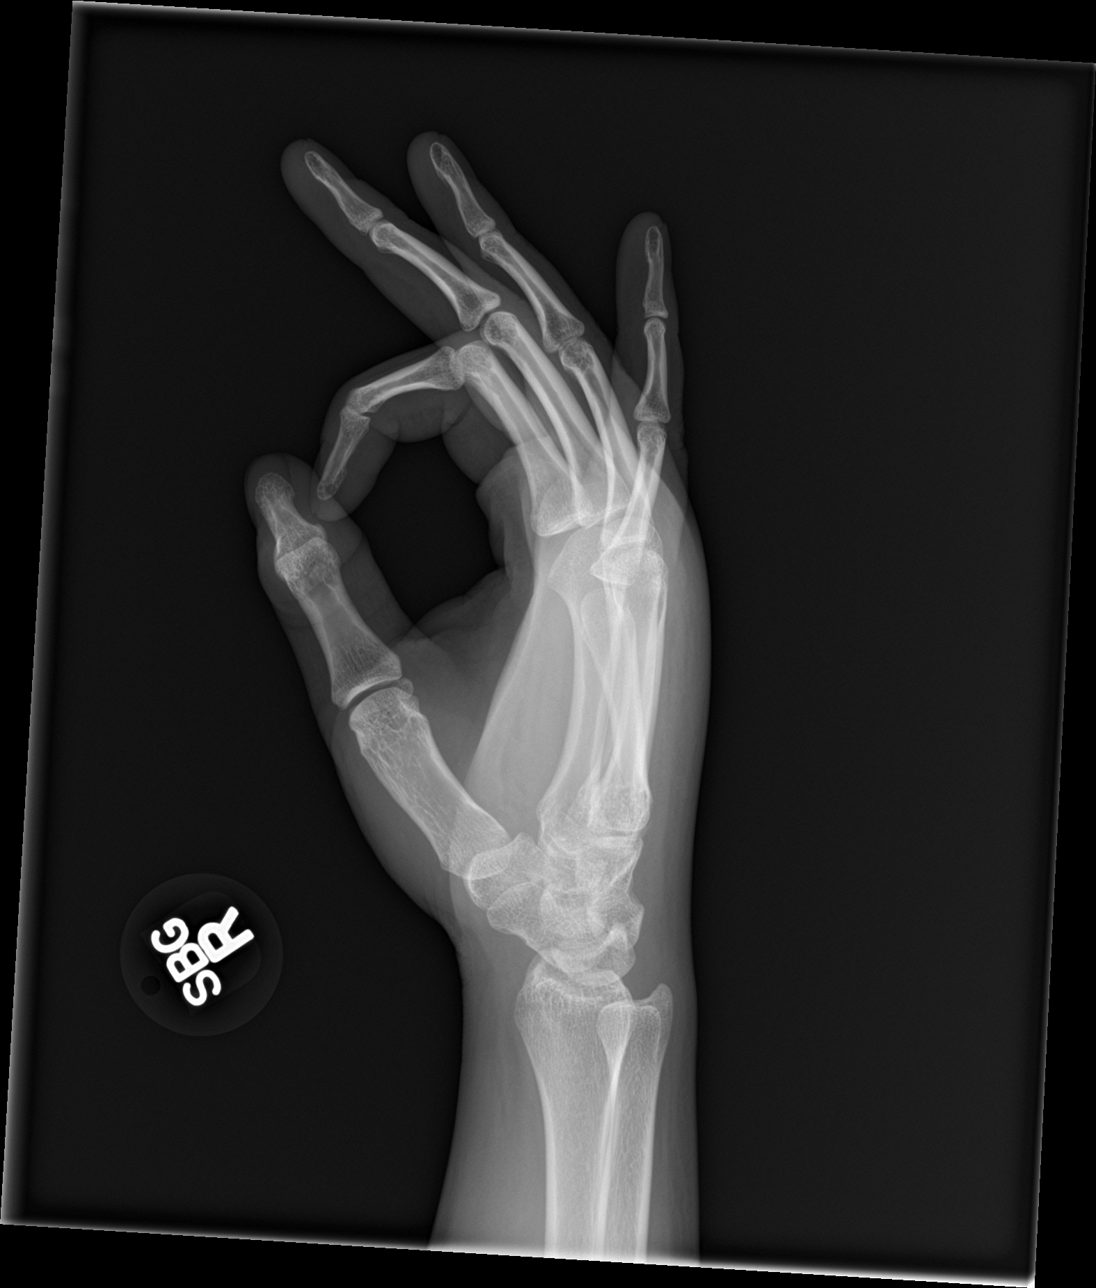

[3 of 3 positions shown; findings below may reference images not displayed]

FINDINGS: A comminuted, mildly displaced fracture seen involving base 5th
metacarpal. No other fractures or bone lesions identified.
IMPRESSION: Mildly displaced and comminuted fracture of the 5th metacarpal base.

## 2019-09-20 ENCOUNTER — Other Ambulatory Visit: Payer: Self-pay

## 2019-09-20 ENCOUNTER — Ambulatory Visit (HOSPITAL_COMMUNITY)
Admission: EM | Admit: 2019-09-20 | Discharge: 2019-09-20 | Disposition: A | Discharge status: Home / Self Care | Payer: Medicaid Other | Attending: Urgent Care | Admitting: Urgent Care

## 2019-09-20 ENCOUNTER — Encounter (HOSPITAL_COMMUNITY): Payer: Self-pay

## 2019-09-20 DIAGNOSIS — M545 Low back pain, unspecified: Secondary | ICD-10-CM

## 2019-09-20 DIAGNOSIS — Z3202 Encounter for pregnancy test, result negative: Secondary | ICD-10-CM | POA: Diagnosis not present

## 2019-09-20 DIAGNOSIS — R109 Unspecified abdominal pain: Secondary | ICD-10-CM | POA: Diagnosis not present

## 2019-09-20 DIAGNOSIS — R1110 Vomiting, unspecified: Secondary | ICD-10-CM | POA: Diagnosis not present

## 2019-09-20 DIAGNOSIS — R82998 Other abnormal findings in urine: Secondary | ICD-10-CM | POA: Diagnosis not present

## 2019-09-20 DIAGNOSIS — Z87891 Personal history of nicotine dependence: Secondary | ICD-10-CM | POA: Diagnosis not present

## 2019-09-20 DIAGNOSIS — M791 Myalgia, unspecified site: Secondary | ICD-10-CM | POA: Diagnosis not present

## 2019-09-20 DIAGNOSIS — Z20822 Contact with and (suspected) exposure to covid-19: Secondary | ICD-10-CM | POA: Diagnosis not present

## 2019-09-20 DIAGNOSIS — N3001 Acute cystitis with hematuria: Secondary | ICD-10-CM | POA: Insufficient documentation

## 2019-09-20 DIAGNOSIS — R112 Nausea with vomiting, unspecified: Secondary | ICD-10-CM | POA: Diagnosis not present

## 2019-09-20 DIAGNOSIS — Z791 Long term (current) use of non-steroidal anti-inflammatories (NSAID): Secondary | ICD-10-CM | POA: Diagnosis not present

## 2019-09-20 DIAGNOSIS — R8290 Unspecified abnormal findings in urine: Secondary | ICD-10-CM

## 2019-09-20 LAB — SARS CORONAVIRUS 2 (TAT 6-24 HRS): SARS Coronavirus 2: NEGATIVE

## 2019-09-20 LAB — POCT URINALYSIS DIPSTICK, ED / UC
Bilirubin Urine: NEGATIVE
Glucose, UA: NEGATIVE mg/dL
Ketones, ur: NEGATIVE mg/dL
Leukocytes,Ua: NEGATIVE
Nitrite: POSITIVE — AB
Protein, ur: NEGATIVE mg/dL
Specific Gravity, Urine: 1.02 (ref 1.005–1.030)
Urobilinogen, UA: 1 mg/dL (ref 0.0–1.0)
pH: 6.5 (ref 5.0–8.0)

## 2019-09-20 LAB — POC URINE PREG, ED: Preg Test, Ur: NEGATIVE

## 2019-09-20 MED ORDER — SULFAMETHOXAZOLE-TRIMETHOPRIM 800-160 MG PO TABS: 1.0000 | ORAL_TABLET | Freq: Two times a day (BID) | ORAL | 0 refills | Status: DC

## 2019-09-20 MED ORDER — CEFTRIAXONE SODIUM 1 G IJ SOLR
1.0000 g | Freq: Once | INTRAMUSCULAR | Status: AC
  Administered 2019-09-20: 1 g via INTRAMUSCULAR

## 2019-09-20 MED ORDER — ONDANSETRON 8 MG PO TBDP: 8.0000 mg | ORAL_TABLET | Freq: Three times a day (TID) | ORAL | 0 refills | Status: DC | PRN

## 2019-09-20 MED ORDER — NAPROXEN 500 MG PO TABS: 500.0000 mg | ORAL_TABLET | Freq: Two times a day (BID) | ORAL | 0 refills | Status: DC

## 2019-09-20 MED ORDER — CEFTRIAXONE SODIUM 1 G IJ SOLR
INTRAMUSCULAR | Status: AC
  Filled 2019-09-20: qty 10

## 2019-09-20 MED ORDER — ONDANSETRON 4 MG PO TBDP
ORAL_TABLET | ORAL | Status: AC
  Filled 2019-09-20: qty 2

## 2019-09-20 MED ORDER — ONDANSETRON 4 MG PO TBDP
8.0000 mg | ORAL_TABLET | Freq: Once | ORAL | Status: AC
  Administered 2019-09-20: 8 mg via ORAL

## 2019-09-20 MED ORDER — IBUPROFEN 800 MG PO TABS
800.0000 mg | ORAL_TABLET | Freq: Once | ORAL | Status: AC
  Administered 2019-09-20: 800 mg via ORAL

## 2019-09-20 MED ORDER — IBUPROFEN 800 MG PO TABS
ORAL_TABLET | ORAL | Status: AC
  Filled 2019-09-20: qty 1

## 2019-09-20 NOTE — ED Triage Notes (Signed)
Pt presents with generalized body aches, back pain, and vomiting since waking up this morning.

## 2019-09-20 NOTE — Discharge Instructions (Addendum)
Make sure you hydrate very well with plain water and a quantity of 64 ounces of water a day.  Please limit drinks that are considered urinary irritants such as soda, sweet tea, coffee, energy drinks, alcohol.  These can worsen your UTI symptoms and also be the source of them.  I will let you know about your urine culture results through MyChart to see if we need to change your antibiotics based off of those results. ° °

## 2019-09-20 NOTE — ED Provider Notes (Signed)
[facility]   MRN: [id number] DOB: 1995-10-01  Subjective:   Julia Park is a 24 y.o. female presenting for acute onset this morning of severe low back pain, flank pain bilaterally, nausea with vomiting, body aches and chills.  Patient states she would like to be checked for everything.  Has not had a cycle since May, has a Nexplanon in place.  She is sexually active, would like to make sure she gets checked for STIs as well.  Has not been vaccinated for COVID-19, once that test to.  No current facility-administered medications for this encounter.  Current Outpatient Medications:  .  naproxen (NAPROSYN) 500 MG tablet, Take 1 tablet (500 mg total) by mouth 2 (two) times daily., Disp: 30 tablet, Rfl: 0   No Known Allergies  Past Medical History:  Diagnosis Date  . Medical history non-contributory   . Seasonal allergies      Past Surgical History:  Procedure Laterality Date  . NO PAST SURGERIES      No family history on file.  Social History   Tobacco Use  . Smoking status: Former Smoker    Packs/day: 0.50    Types: Cigars, Cigarettes    Quit date: 06/10/2015    Years since quitting: 4.2  . Smokeless tobacco: Never Used  Substance Use Topics  . Alcohol use: No  . Drug use: Yes    Types: Marijuana    Comment: used a few months ago    ROS   Objective:   Vitals: BP 119/60 (BP Location: Left Arm)   Pulse 83   Temp 98.7 F (37.1 C) (Oral)   Resp 17   SpO2 100%   Physical Exam Constitutional:      General: She is not in acute distress.    Appearance: Normal appearance. She is well-developed. She is not ill-appearing, toxic-appearing or diaphoretic.  HENT:     Head: Normocephalic and atraumatic.     Right Ear: External ear normal.     Left Ear: External ear normal.     Nose: Nose normal.     Mouth/Throat:     Mouth: Mucous membranes are moist.     Pharynx: Oropharynx is clear.  Eyes:     General: No scleral icterus.       Right eye: No  discharge.        Left eye: No discharge.     Extraocular Movements: Extraocular movements intact.     Conjunctiva/sclera: Conjunctivae normal.     Pupils: Pupils are equal, round, and reactive to light.  Cardiovascular:     Rate and Rhythm: Normal rate and regular rhythm.     Pulses: Normal pulses.     Heart sounds: Normal heart sounds. No murmur heard.  No friction rub. No gallop.   Pulmonary:     Effort: Pulmonary effort is normal. No respiratory distress.     Breath sounds: Normal breath sounds. No stridor. No wheezing, rhonchi or rales.  Abdominal:     General: Bowel sounds are normal. There is no distension.     Palpations: Abdomen is soft. There is no mass.     Tenderness: There is no abdominal tenderness. There is right CVA tenderness and left CVA tenderness. There is no guarding or rebound.  Skin:    General: Skin is warm and dry.     Findings: No rash.  Neurological:     General: No focal deficit present.     Mental Status: She is alert and oriented to  person, place, and time.  Psychiatric:        Mood and Affect: Mood normal.        Behavior: Behavior normal.        Thought Content: Thought content normal.        Judgment: Judgment normal.     Results for orders placed or performed during the hospital encounter of 09/20/19 (from the past 24 hour(s))  POC Urinalysis dipstick     Status: Abnormal   Collection Time: 09/20/19 11:15 AM  Result Value Ref Range   Glucose, UA NEGATIVE NEGATIVE mg/dL   Bilirubin Urine NEGATIVE NEGATIVE   Ketones, ur NEGATIVE NEGATIVE mg/dL   Specific Gravity, Urine 1.020 1.005 - 1.030   Hgb urine dipstick TRACE (A) NEGATIVE   pH 6.5 5.0 - 8.0   Protein, ur NEGATIVE NEGATIVE mg/dL   Urobilinogen, UA 1.0 0.0 - 1.0 mg/dL   Nitrite POSITIVE (A) NEGATIVE   Leukocytes,Ua NEGATIVE NEGATIVE  POC urine pregnancy     Status: None   Collection Time: 09/20/19 11:24 AM  Result Value Ref Range   Preg Test, Ur NEGATIVE NEGATIVE    Assessment and  Plan :   PDMP not reviewed this encounter.  1. Acute hemorrhagic cystitis   2. Malodorous urine   3. Acute bilateral low back pain without sciatica   4. Nausea and vomiting, intractability of vomiting not specified, unspecified vomiting type   5. Bilateral flank pain     It is unclear to me if she has true pyelonephritis but will cover for this given bilateral low back pain, flank pain/CVA tenderness.  IM ceftriaxone in clinic.  Start Bactrim as an outpatient.  Hydrate aggressively.  Urine culture pending.  Ibuprofen also given in clinic together with Zofran.  Use Zofran and naproxen for supportive care at home.  COVID-19 testing, STI testing pending counseled patient on potential for adverse effects with medications prescribed/recommended today, ER and return-to-clinic precautions discussed, patient verbalized understanding.    Wallis Bamberg, [place] 09/20/19 1217

## 2019-09-21 LAB — CERVICOVAGINAL ANCILLARY ONLY
Chlamydia: POSITIVE — AB
Comment: NEGATIVE
Comment: NEGATIVE
Comment: NORMAL
Neisseria Gonorrhea: NEGATIVE
Trichomonas: NEGATIVE

## 2019-09-25 ENCOUNTER — Telehealth (HOSPITAL_COMMUNITY): Payer: Self-pay

## 2019-09-25 ENCOUNTER — Ambulatory Visit (HOSPITAL_COMMUNITY)
Admission: EM | Admit: 2019-09-25 | Discharge: 2019-09-25 | Disposition: A | Discharge status: Home / Self Care | Payer: Medicaid Other

## 2019-09-25 ENCOUNTER — Other Ambulatory Visit: Payer: Self-pay

## 2019-09-25 MED ORDER — AZITHROMYCIN 250 MG PO TABS: 1000.0000 mg | ORAL_TABLET | Freq: Once | ORAL | 0 refills | Status: AC

## 2019-09-25 NOTE — ED Notes (Signed)
Pt states she did not get mychart message about test results but could see that she tested positive for chylamydia. She presented today for treatment. I informed pt that Rx was sent to pharmacy already. She stated she was unaware. Pt does not want to see provider she will go to pharmacy to pick up medication.

## 2019-11-14 ENCOUNTER — Encounter (HOSPITAL_COMMUNITY): Payer: Self-pay

## 2019-11-14 ENCOUNTER — Emergency Department (HOSPITAL_COMMUNITY)
Admission: EM | Admit: 2019-11-14 | Discharge: 2019-11-14 | Disposition: A | Discharge status: Home / Self Care | Payer: Medicaid Other | Attending: Emergency Medicine | Admitting: Emergency Medicine

## 2019-11-14 DIAGNOSIS — W268XXA Contact with other sharp object(s), not elsewhere classified, initial encounter: Secondary | ICD-10-CM | POA: Insufficient documentation

## 2019-11-14 DIAGNOSIS — Z23 Encounter for immunization: Secondary | ICD-10-CM | POA: Insufficient documentation

## 2019-11-14 DIAGNOSIS — S61412A Laceration without foreign body of left hand, initial encounter: Secondary | ICD-10-CM | POA: Diagnosis not present

## 2019-11-14 DIAGNOSIS — Z87891 Personal history of nicotine dependence: Secondary | ICD-10-CM | POA: Diagnosis not present

## 2019-11-14 MED ORDER — LIDOCAINE HCL (PF) 1 % IJ SOLN
INTRAMUSCULAR | Status: AC
  Filled 2019-11-14: qty 30

## 2019-11-14 MED ORDER — TETANUS-DIPHTH-ACELL PERTUSSIS 5-2.5-18.5 LF-MCG/0.5 IM SUSP
0.5000 mL | Freq: Once | INTRAMUSCULAR | Status: AC
  Administered 2019-11-14: 0.5 mL via INTRAMUSCULAR
  Filled 2019-11-14: qty 0.5

## 2019-11-14 MED ORDER — LIDOCAINE-EPINEPHRINE 1 %-1:100000 IJ SOLN: 10.0000 mL | Freq: Once | INTRAMUSCULAR | Status: DC

## 2019-11-14 MED ORDER — ACETAMINOPHEN 500 MG PO TABS
1000.0000 mg | ORAL_TABLET | Freq: Once | ORAL | Status: AC
  Administered 2019-11-14: 1000 mg via ORAL
  Filled 2019-11-14: qty 2

## 2019-11-14 NOTE — Discharge Instructions (Signed)
Sutured repair °Keep the laceration site dry for the next 24 hours and leave the dressing in place. After 24 hours you may remove the dressing and gently clean the laceration site with antibacterial soap and warm water. Do not scrub the area. Do not soak the area and water for long periods of time. Don’t use hydrogen peroxide, iodine-based solutions, or alcohol, which can slow healing, and will probably be painful! Apply topical bacitracin 1-2 times per day for the next 3-5 days. Return to the emergency department in 7-10 days for removal of the sutures.  You should return sooner for any signs of infection which would include increased redness around the wound, increased swelling, new drainage of yellow pus.  °

## 2019-11-14 NOTE — ED Provider Notes (Signed)
[facility] EMERGENCY DEPARTMENT Provider Note   CSN: [id number] Arrival date & time: 11/14/19  1328     History Chief Complaint  Patient presents with  . Laceration    Julia Park is a 24 y.o. female.  HPI  Patient is 24 year old female with no pertinent past medical history presented a for left hand laceration that occurred 1 to 5-hour prior to arrival in ER states it is 8/10 pain, achy, constant, worse with touch and movement.  States that she cut her self with a box cutter when she was trying to carve a pumpkin.  She states it was a new blade and was clean.  She has no other injuries.  She states she has good sensation and movement of her hand.  States she is uncertain when her last tetanus was.    Past Medical History:  Diagnosis Date  . Medical history non-contributory   . Seasonal allergies     Patient Active Problem List   Diagnosis Date Noted  . Nexplanon insertion 09/15/2015  . Post term pregnancy 09/13/2015    Past Surgical History:  Procedure Laterality Date  . NO PAST SURGERIES       OB History    Gravida  1   Para  1   Term  1   Preterm  0   AB  0   Living  1     SAB  0   TAB  0   Ectopic  0   Multiple  0   Live Births  1           Family History  Family history unknown: Yes    Social History   Tobacco Use  . Smoking status: Former Smoker    Packs/day: 0.50    Types: Cigars, Cigarettes    Quit date: 06/10/2015    Years since quitting: 4.4  . Smokeless tobacco: Never Used  Substance Use Topics  . Alcohol use: No  . Drug use: Yes    Types: Marijuana    Comment: used a few months ago    Home Medications Prior to Admission medications   Medication Sig Start Date End Date Taking? Authorizing Provider  naproxen (NAPROSYN) 500 MG tablet Take 1 tablet (500 mg total) by mouth 2 (two) times daily with a meal. 09/20/19   Wallis Bamberg, PA-C  ondansetron (ZOFRAN-ODT) 8 MG disintegrating tablet Take 1 tablet  (8 mg total) by mouth every 8 (eight) hours as needed for nausea or vomiting. 09/20/19   Wallis Bamberg, PA-C  sulfamethoxazole-trimethoprim (BACTRIM DS) 800-160 MG tablet Take 1 tablet by mouth 2 (two) times daily. 09/20/19   Wallis Bamberg, PA-C    Allergies    Patient has no known allergies.  Review of Systems   Review of Systems  Constitutional: Negative for fever.  HENT: Negative for congestion.   Respiratory: Negative for shortness of breath.   Cardiovascular: Negative for chest pain.  Gastrointestinal: Negative for abdominal distention.  Skin:       Hand laceration  Neurological: Negative for dizziness and headaches.    Physical Exam Updated Vital Signs BP 124/79 (BP Location: Right Arm)   Pulse 74   Temp 98.7 F (37.1 C) (Oral)   Resp 18   Ht 5\' 2"  (1.575 m)   Wt 77.1 kg   SpO2 100%   BMI 31.09 kg/m   Physical Exam Vitals and nursing note reviewed.  Constitutional:      General: She is not in acute  distress.    Appearance: Normal appearance. She is not ill-appearing.  HENT:     Head: Normocephalic and atraumatic.  Eyes:     General:        Right eye: No discharge.        Left eye: No discharge.     Conjunctiva/sclera: Conjunctivae normal.  Pulmonary:     Effort: Pulmonary effort is normal.     Breath sounds: No stridor.  Musculoskeletal:     Comments: Strength 5/5 of flexion extension of all fingers and thumb.  Skin:    General: Skin is warm and dry.     Capillary Refill: Capillary refill takes less than 2 seconds.     Comments: 2 cm laceration to the right hand over the palmar surface of the hand between the thenar emenince and the base of the second digit.  Area is mildly gaping.  No active bleeding.  Neurological:     Mental Status: She is alert and oriented to person, place, and time. Mental status is at baseline.     Comments: Sensation intact in all extremity fingers.     ED Results / Procedures / Treatments   Labs (all labs ordered are listed, but  only abnormal results are displayed) Labs Reviewed - No data to display  EKG None  Radiology No results found.  Procedures .Marland KitchenLaceration Repair  Date/Time: 11/14/2019 4:19 PM Performed by: Gailen Shelter, PA Authorized by: Gailen Shelter, PA   Consent:    Consent obtained:  Verbal   Consent given by:  Patient   Risks discussed:  Infection, need for additional repair, pain, poor cosmetic result and poor wound healing   Alternatives discussed:  No treatment and delayed treatment Universal protocol:    Procedure explained and questions answered to patient or proxy's satisfaction: yes     Relevant documents present and verified: yes     Test results available and properly labeled: yes     Imaging studies available: yes     Required blood products, implants, devices, and special equipment available: yes     Site/side marked: yes     Immediately prior to procedure, a time out was called: yes     Patient identity confirmed:  Verbally with patient Anesthesia (see MAR for exact dosages):    Anesthesia method:  Local infiltration Laceration details:    Location:  Hand   Hand location:  R palm   Length (cm):  2 Exploration:    Hemostasis achieved with:  Direct pressure   Wound exploration: wound explored through full range of motion     Wound extent: no foreign bodies/material noted and no tendon damage noted     Contaminated: no   Treatment:    Area cleansed with:  Saline   Amount of cleaning:  Standard   Irrigation solution:  Sterile saline   Irrigation method:  Pressure wash   Visualized foreign bodies/material removed: no   Skin repair:    Repair method:  Sutures   Suture size:  4-0   Suture material:  Prolene   Number of sutures:  2 Approximation:    Approximation:  Close Post-procedure details:    Dressing:  Antibiotic ointment and non-adherent dressing   Patient tolerance of procedure:  Tolerated well, no immediate complications   (including critical care  time)  Medications Ordered in ED Medications  lidocaine-EPINEPHrine (XYLOCAINE W/EPI) 1 %-1:100000 (with pres) injection 10 mL (has no administration in time range)  lidocaine (PF) (XYLOCAINE) 1 %  injection (has no administration in time range)  Tdap (BOOSTRIX) injection 0.5 mL (0.5 mLs Intramuscular Given 11/14/19 1504)  acetaminophen (TYLENOL) tablet 1,000 mg (1,000 mg Oral Given 11/14/19 1609)    ED Course  I have reviewed the triage vital signs and the nursing notes.  Pertinent labs & imaging results that were available during my care of the patient were reviewed by me and considered in my medical decision making (see chart for details).    MDM Rules/Calculators/A&P                           Laceration to the right hand palm.   Repaired successfully.   Pressure irrigation performed. Wound explored and base of wound visualized in a bloodless field without evidence of foreign body.  Laceration occurred < 8 hours prior to repair which was well tolerated. Tdap updated.  Pt has no comorbidities to effect normal wound healing. Pt discharged without antibiotics.  Discussed suture home care with patient and answered questions. Pt to follow-up for wound check and suture removal in 7 days; they are to return to the ED sooner for signs of infection. Pt is hemodynamically stable with no complaints prior to dc.   Patient understands wound care instructions.  Final Clinical Impression(s) / ED Diagnoses Final diagnoses:  Laceration of left hand without foreign body, initial encounter    Rx / DC Orders ED Discharge Orders    None       Gailen Shelter, [place] 11/14/19 1619    Sabino Donovan, MD 11/14/19 1911

## 2019-11-14 NOTE — ED Triage Notes (Signed)
Pt arrives to ED w/ lac to L hand that occurred while carving pumpkins. Pt reports 8/10 pain. Bleeding controlled in triage.

## 2019-11-14 NOTE — ED Notes (Signed)
Patient verbalizes understanding of discharge instructions. Opportunity for questioning and answers were provided. Pt discharged from ED. 

## 2019-11-14 NOTE — ED Notes (Signed)
Dressing applied to lac site, pt sent home with additional bandage supplies

## 2019-11-20 ENCOUNTER — Other Ambulatory Visit: Payer: Self-pay

## 2019-11-20 ENCOUNTER — Ambulatory Visit (HOSPITAL_COMMUNITY)
Admission: EM | Admit: 2019-11-20 | Discharge: 2019-11-20 | Disposition: A | Discharge status: Home / Self Care | Payer: Medicaid Other | Attending: Emergency Medicine | Admitting: Emergency Medicine

## 2019-11-20 ENCOUNTER — Encounter (HOSPITAL_COMMUNITY): Payer: Self-pay | Admitting: *Deleted

## 2019-11-20 DIAGNOSIS — J209 Acute bronchitis, unspecified: Secondary | ICD-10-CM | POA: Diagnosis not present

## 2019-11-20 DIAGNOSIS — J069 Acute upper respiratory infection, unspecified: Secondary | ICD-10-CM | POA: Insufficient documentation

## 2019-11-20 DIAGNOSIS — Z20822 Contact with and (suspected) exposure to covid-19: Secondary | ICD-10-CM

## 2019-11-20 LAB — RESP PANEL BY RT PCR (RSV, FLU A&B, COVID)
Influenza A by PCR: NEGATIVE
Influenza B by PCR: NEGATIVE
Respiratory Syncytial Virus by PCR: NEGATIVE
SARS Coronavirus 2 by RT PCR: NEGATIVE

## 2019-11-20 MED ORDER — ALBUTEROL SULFATE HFA 108 (90 BASE) MCG/ACT IN AERS: 1.0000 | INHALATION_SPRAY | Freq: Four times a day (QID) | RESPIRATORY_TRACT | 0 refills | Status: AC | PRN

## 2019-11-20 MED ORDER — PREDNISONE 50 MG PO TABS: 50.0000 mg | ORAL_TABLET | Freq: Every day | ORAL | 0 refills | Status: AC

## 2019-11-20 MED ORDER — BENZONATATE 200 MG PO CAPS: 200.0000 mg | ORAL_CAPSULE | Freq: Three times a day (TID) | ORAL | 0 refills | Status: AC | PRN

## 2019-11-20 NOTE — Discharge Instructions (Signed)
Covid test pending Albuterol inhaler as needed for shortness of breath chest tightness and wheezing Prednisone daily for 5 days to help with chest inflammation Continue Mucinex Tessalon/benzonatate every 8 hours for cough Rest and fluids Honey and hot tea Ibuprofen and Tylenol for body aches, headache Follow-up if not improving or worsening

## 2019-11-20 NOTE — ED Triage Notes (Signed)
Patient in with complaints of a productive cough, sore throat, runny nose and body aches x 3 days. Patient states that sputum is yellow in color. Patient has complaints of mid chest pain and describes it as an ache. Patient has tried Mucinex at home but symptoms are not improving. Patient has not received COVID vaccines.

## 2019-11-20 NOTE — ED Notes (Signed)
Patient called x 2 with no answer 

## 2019-11-20 NOTE — ED Notes (Signed)
Patient was standing outside on phone.  Is waiting in lobby now.

## 2019-11-20 NOTE — ED Provider Notes (Signed)
[facility]    CSN: [id number] Arrival date & time: 11/20/19  1147      History   Chief Complaint Chief Complaint  Patient presents with  . Cough  . Sore Throat  . Nasal Congestion    HPI Julia Park is a 24 y.o. female presenting today for evaluation of cough and congestion.  Patient reports that over the past 3 days she has had a cough, congestion, sore throat, rhinorrhea, body aches.  Has had a lot of mucus production from chest and reports some chest discomfort.  She does report occasional smoking of black and milds.  Using Mucinex with temporary relief.  HPI  Past Medical History:  Diagnosis Date  . Medical history non-contributory   . Seasonal allergies     Patient Active Problem List   Diagnosis Date Noted  . Nexplanon insertion 09/15/2015  . Post term pregnancy 09/13/2015    Past Surgical History:  Procedure Laterality Date  . NO PAST SURGERIES      OB History    Gravida  1   Para  1   Term  1   Preterm  0   AB  0   Living  1     SAB  0   TAB  0   Ectopic  0   Multiple  0   Live Births  1            Home Medications    Prior to Admission medications   Medication Sig Start Date End Date Taking? Authorizing Provider  naproxen (NAPROSYN) 500 MG tablet Take 1 tablet (500 mg total) by mouth 2 (two) times daily with a meal. 09/20/19  Yes Wallis Bamberg, PA-C  ondansetron (ZOFRAN-ODT) 8 MG disintegrating tablet Take 1 tablet (8 mg total) by mouth every 8 (eight) hours as needed for nausea or vomiting. 09/20/19  Yes Wallis Bamberg, PA-C  albuterol (VENTOLIN HFA) 108 (90 Base) MCG/ACT inhaler Inhale 1-2 puffs into the lungs every 6 (six) hours as needed for wheezing or shortness of breath. 11/20/19   Ector Laurel C, PA-C  benzonatate (TESSALON) 200 MG capsule Take 1 capsule (200 mg total) by mouth 3 (three) times daily as needed for up to 7 days for cough. 11/20/19 11/27/19  Meya Clutter C, PA-C  predniSONE (DELTASONE) 50 MG  tablet Take 1 tablet (50 mg total) by mouth daily with breakfast for 5 days. 11/20/19 11/25/19  Jamiah Homeyer, Junius Creamer, PA-C    Family History Family History  Family history unknown: Yes    Social History Social History   Tobacco Use  . Smoking status: Former Smoker    Packs/day: 0.50    Types: Cigars, Cigarettes    Quit date: 06/10/2015    Years since quitting: 4.4  . Smokeless tobacco: Never Used  Vaping Use  . Vaping Use: Never used  Substance Use Topics  . Alcohol use: No  . Drug use: Yes    Types: Marijuana    Comment: occ     Allergies   Patient has no known allergies.   Review of Systems Review of Systems  Constitutional: Positive for fatigue. Negative for activity change, appetite change, chills and fever.  HENT: Positive for congestion and sore throat. Negative for ear pain, rhinorrhea, sinus pressure and trouble swallowing.   Eyes: Negative for discharge and redness.  Respiratory: Positive for cough, chest tightness, shortness of breath and wheezing.   Cardiovascular: Negative for chest pain.  Gastrointestinal: Negative for abdominal pain, diarrhea, nausea  and vomiting.  Musculoskeletal: Positive for myalgias.  Skin: Negative for rash.  Neurological: Negative for dizziness, light-headedness and headaches.     Physical Exam Triage Vital Signs ED Triage Vitals  Enc Vitals Group     BP      Pulse      Resp      Temp      Temp src      SpO2      Weight      Height      Head Circumference      Peak Flow      Pain Score      Pain Loc      Pain Edu?      Excl. in GC?    No data found.  Updated Vital Signs BP 119/65 (BP Location: Right Arm)   Pulse 92   Temp 99.6 F (37.6 C) (Oral)   Resp 20   SpO2 98%   Visual Acuity Right Eye Distance:   Left Eye Distance:   Bilateral Distance:    Right Eye Near:   Left Eye Near:    Bilateral Near:     Physical Exam Vitals and nursing note reviewed.  Constitutional:      Appearance: She is  well-developed.     Comments: No acute distress  HENT:     Head: Normocephalic and atraumatic.     Ears:     Comments: Bilateral ears without tenderness to palpation of external auricle, tragus and mastoid, EAC's without erythema or swelling, TM's with good bony landmarks and cone of light. Non erythematous.     Nose: Nose normal.     Mouth/Throat:     Comments: Oral mucosa pink and moist, no tonsillar enlargement or exudate. Posterior pharynx patent and nonerythematous, no uvula deviation or swelling. Normal phonation. Eyes:     Conjunctiva/sclera: Conjunctivae normal.  Cardiovascular:     Rate and Rhythm: Normal rate.  Pulmonary:     Effort: Pulmonary effort is normal. No respiratory distress.     Comments: Occasional coarse cough in room, faint expiratory wheezing noted in bilateral lung fields, no crackles or rales Abdominal:     General: There is no distension.  Musculoskeletal:        General: Normal range of motion.     Cervical back: Neck supple.  Skin:    General: Skin is warm and dry.  Neurological:     Mental Status: She is alert and oriented to person, place, and time.      UC Treatments / Results  Labs (all labs ordered are listed, but only abnormal results are displayed) Labs Reviewed  RESP PANEL BY RT PCR (RSV, FLU A&B, COVID)    EKG   Radiology No results found.  Procedures Procedures (including critical care time)  Medications Ordered in UC Medications - No data to display  Initial Impression / Assessment and Plan / UC Course  I have reviewed the triage vital signs and the nursing notes.  Pertinent labs & imaging results that were available during my care of the patient were reviewed by me and considered in my medical decision making (see chart for details).     3 days of URI symptoms, low-grade fever today, wheezing noted on auscultation.  Treating for bronchitis.  Providing albuterol prednisone and Tessalon to begin along with Mucinex.  Rest  and fluids.  Covid test pending.  Discussed strict return precautions. Patient verbalized understanding and is agreeable with plan.  Final Clinical Impressions(s) /  UC Diagnoses   Final diagnoses:  Viral URI with cough  Acute bronchitis, unspecified organism     Discharge Instructions     Covid test pending Albuterol inhaler as needed for shortness of breath chest tightness and wheezing Prednisone daily for 5 days to help with chest inflammation Continue Mucinex Tessalon/benzonatate every 8 hours for cough Rest and fluids Honey and hot tea Ibuprofen and Tylenol for body aches, headache Follow-up if not improving or worsening    ED Prescriptions    Medication Sig Dispense Auth. Provider   predniSONE (DELTASONE) 50 MG tablet Take 1 tablet (50 mg total) by mouth daily with breakfast for 5 days. 5 tablet Ayan Yankey C, PA-C   albuterol (VENTOLIN HFA) 108 (90 Base) MCG/ACT inhaler Inhale 1-2 puffs into the lungs every 6 (six) hours as needed for wheezing or shortness of breath. 18 g Aleanna Menge C, PA-C   benzonatate (TESSALON) 200 MG capsule Take 1 capsule (200 mg total) by mouth 3 (three) times daily as needed for up to 7 days for cough. 28 capsule Heywood Tokunaga, [place] C, PA-C     PDMP not reviewed this encounter.   Lew Dawes, PA-C 11/20/19 1521

## 2020-01-16 ENCOUNTER — Encounter (HOSPITAL_COMMUNITY): Payer: Self-pay | Admitting: Emergency Medicine

## 2020-01-16 ENCOUNTER — Encounter (HOSPITAL_COMMUNITY): Payer: Self-pay

## 2020-01-16 ENCOUNTER — Emergency Department (HOSPITAL_COMMUNITY)
Admission: EM | Admit: 2020-01-16 | Discharge: 2020-01-16 | Disposition: A | Discharge status: Home / Self Care | Payer: Medicaid Other | Attending: Emergency Medicine | Admitting: Emergency Medicine

## 2020-01-16 ENCOUNTER — Other Ambulatory Visit: Payer: Self-pay

## 2020-01-16 DIAGNOSIS — M549 Dorsalgia, unspecified: Secondary | ICD-10-CM | POA: Insufficient documentation

## 2020-01-16 DIAGNOSIS — U071 COVID-19: Secondary | ICD-10-CM | POA: Insufficient documentation

## 2020-01-16 DIAGNOSIS — R509 Fever, unspecified: Secondary | ICD-10-CM | POA: Diagnosis not present

## 2020-01-16 DIAGNOSIS — Z87891 Personal history of nicotine dependence: Secondary | ICD-10-CM | POA: Insufficient documentation

## 2020-01-16 DIAGNOSIS — R519 Headache, unspecified: Secondary | ICD-10-CM | POA: Diagnosis present

## 2020-01-16 NOTE — ED Triage Notes (Signed)
Pt here for multiple complaints including migraine-headache, back pain, & fever. Pt states everything started this morning & has progressively gotten worse. States she's tried to take "all kinds of medicine" & "sleep it off" but is unable to do so. Pt denies any known sick contact, injury or other precipitating factors.

## 2020-01-16 NOTE — ED Triage Notes (Signed)
Pt reports lower back pain and headache since this morning. Has tried ibuprofen without relief. She left [facility] earlier d/t wait time.

## 2020-01-17 ENCOUNTER — Emergency Department (HOSPITAL_COMMUNITY)
Admission: EM | Admit: 2020-01-17 | Discharge: 2020-01-17 | Disposition: A | Discharge status: Home / Self Care | Payer: Medicaid Other | Source: Home / Self Care | Attending: Emergency Medicine | Admitting: Emergency Medicine

## 2020-01-17 DIAGNOSIS — U071 COVID-19: Secondary | ICD-10-CM

## 2020-01-17 LAB — RESP PANEL BY RT-PCR (FLU A&B, COVID) ARPGX2
Influenza A by PCR: NEGATIVE
Influenza B by PCR: NEGATIVE
SARS Coronavirus 2 by RT PCR: POSITIVE — AB

## 2020-01-17 MED ORDER — DIPHENHYDRAMINE HCL 50 MG/ML IJ SOLN
25.0000 mg | Freq: Once | INTRAMUSCULAR | Status: AC
  Administered 2020-01-17: 05:00:00 25 mg via INTRAVENOUS
  Filled 2020-01-17: qty 1

## 2020-01-17 MED ORDER — METOCLOPRAMIDE HCL 5 MG/ML IJ SOLN
10.0000 mg | Freq: Once | INTRAMUSCULAR | Status: AC
  Administered 2020-01-17: 05:00:00 10 mg via INTRAVENOUS
  Filled 2020-01-17: qty 2

## 2020-01-17 MED ORDER — SODIUM CHLORIDE 0.9 % IV BOLUS
1000.0000 mL | Freq: Once | INTRAVENOUS | Status: AC
  Administered 2020-01-17: 05:00:00 1000 mL via INTRAVENOUS

## 2020-01-17 MED ORDER — KETOROLAC TROMETHAMINE 30 MG/ML IJ SOLN
30.0000 mg | Freq: Once | INTRAMUSCULAR | Status: AC
  Administered 2020-01-17: 05:00:00 30 mg via INTRAVENOUS
  Filled 2020-01-17: qty 1

## 2020-01-17 NOTE — ED Provider Notes (Signed)
[facility]-EMERGENCY DEPT Provider Note   CSN: [id number] Arrival date & time: 01/16/20  2257     History Chief Complaint  Patient presents with  . Headache  . Back Pain    Julia Park is a 24 y.o. female.  Patient presents to the emergency department with a chief complaint of headache.  She also reports generalized body aches and some nausea.  She states that she was running a fever to 104 earlier tonight.  She denies any cough or congestion.  She is unvaccinated against flu and Covid.  She denies any known sick contacts.  She has tried taking OTC medications without relief.  She denies numbness, weakness, or tingling.  The history is provided by the patient. No language interpreter was used.       Past Medical History:  Diagnosis Date  . Medical history non-contributory   . Seasonal allergies     Patient Active Problem List   Diagnosis Date Noted  . Nexplanon insertion 09/15/2015  . Post term pregnancy 09/13/2015    Past Surgical History:  Procedure Laterality Date  . NO PAST SURGERIES       OB History    Gravida  1   Para  1   Term  1   Preterm  0   AB  0   Living  1     SAB  0   IAB  0   Ectopic  0   Multiple  0   Live Births  1           Family History  Family history unknown: Yes    Social History   Tobacco Use  . Smoking status: Former Smoker    Packs/day: 0.50    Types: Cigars, Cigarettes    Quit date: 06/10/2015    Years since quitting: 4.6  . Smokeless tobacco: Never Used  Vaping Use  . Vaping Use: Never used  Substance Use Topics  . Alcohol use: No  . Drug use: Yes    Types: Marijuana    Comment: occ    Home Medications Prior to Admission medications   Medication Sig Start Date End Date Taking? Authorizing Provider  albuterol (VENTOLIN HFA) 108 (90 Base) MCG/ACT inhaler Inhale 1-2 puffs into the lungs every 6 (six) hours as needed for wheezing or shortness of breath. 11/20/19   Wieters,  Hallie C, PA-C  naproxen (NAPROSYN) 500 MG tablet Take 1 tablet (500 mg total) by mouth 2 (two) times daily with a meal. 09/20/19   Wallis Bamberg, PA-C  ondansetron (ZOFRAN-ODT) 8 MG disintegrating tablet Take 1 tablet (8 mg total) by mouth every 8 (eight) hours as needed for nausea or vomiting. 09/20/19   Wallis Bamberg, PA-C    Allergies    Patient has no known allergies.  Review of Systems   Review of Systems  All other systems reviewed and are negative.   Physical Exam Updated Vital Signs BP 135/80   Pulse 80   Temp 99.9 F (37.7 C) (Oral)   Resp 17   Ht 5\' 2"  (1.575 m)   Wt 77.1 kg   SpO2 100%   BMI 31.09 kg/m   Physical Exam Vitals and nursing note reviewed.  Constitutional:      General: She is not in acute distress.    Appearance: She is well-developed and well-nourished.  HENT:     Head: Normocephalic and atraumatic.  Eyes:     Conjunctiva/sclera: Conjunctivae normal.  Neck:  Comments: No difficulty moving neck Cardiovascular:     Rate and Rhythm: Normal rate and regular rhythm.     Heart sounds: No murmur heard.   Pulmonary:     Effort: Pulmonary effort is normal. No respiratory distress.     Breath sounds: Normal breath sounds.  Abdominal:     Palpations: Abdomen is soft.     Tenderness: There is no abdominal tenderness.  Musculoskeletal:        General: No edema. Normal range of motion.     Cervical back: Normal range of motion and neck supple. No rigidity.  Skin:    General: Skin is warm and dry.  Neurological:     Mental Status: She is alert and oriented to person, place, and time.     Comments: CN III-XII grossly intact, moves all extremities, speech is clear  Psychiatric:        Mood and Affect: Mood and affect and mood normal.        Behavior: Behavior normal.     ED Results / Procedures / Treatments   Labs (all labs ordered are listed, but only abnormal results are displayed) Labs Reviewed  RESP PANEL BY RT-PCR (FLU A&B, COVID) [id number] -  Abnormal; Notable for the following components:      Result Value   SARS Coronavirus 2 by RT PCR POSITIVE (*)    All other components within normal limits    EKG None  Radiology No results found.  Procedures Procedures (including critical care time)  Medications Ordered in ED Medications  ketorolac (TORADOL) 30 MG/ML injection 30 mg (has no administration in time range)  metoCLOPramide (REGLAN) injection 10 mg (has no administration in time range)  diphenhydrAMINE (BENADRYL) injection 25 mg (has no administration in time range)  sodium chloride 0.9 % bolus 1,000 mL (has no administration in time range)    ED Course  I have reviewed the triage vital signs and the nursing notes.  Pertinent labs & imaging results that were available during my care of the patient were reviewed by me and considered in my medical decision making (see chart for details).    MDM Rules/Calculators/A&P                          Patient here with generalized body aches and headache.  She denies having had cough, but does report that she has had a fever to 104 prior to arrival.  In triage her temperature is 99.9.  She is unvaccinated against Covid and flu.  I am suspicious that she has 1 of these illnesses.  She is able to move her neck without difficulty.  She has no meningeal signs.  I doubt meningitis.  Will give Toradol, Reglan, and Benadryl for headache.  Will give some fluids.  6:28 AM COVID test is positive.  This fits with her symptoms.  Julia Park was evaluated in Emergency Department on 01/17/2020 for the symptoms described in the history of present illness. She was evaluated in the context of the global COVID-19 pandemic, which necessitated consideration that the patient might be at risk for infection with the SARS-CoV-2 virus that causes COVID-19. Institutional protocols and algorithms that pertain to the evaluation of patients at risk for COVID-19 are in a state of rapid change based on  information released by regulatory bodies including the CDC and federal and state organizations. These policies and algorithms were followed during the patient's care in the ED.  Final Clinical Impression(s) / ED Diagnoses Final diagnoses:  COVID-19    Rx / DC Orders ED Discharge Orders    None       Roxy Horseman, PA-C 01/17/20 3833    Marily Memos, MD 01/17/20 [phone]

## 2020-01-18 ENCOUNTER — Emergency Department (HOSPITAL_COMMUNITY): Payer: Medicaid Other

## 2020-01-18 ENCOUNTER — Other Ambulatory Visit: Payer: Self-pay

## 2020-01-18 ENCOUNTER — Emergency Department (HOSPITAL_COMMUNITY)
Admission: EM | Admit: 2020-01-18 | Discharge: 2020-01-18 | Disposition: A | Discharge status: Home / Self Care | Payer: Medicaid Other | Attending: Emergency Medicine | Admitting: Emergency Medicine

## 2020-01-18 ENCOUNTER — Encounter (HOSPITAL_COMMUNITY): Payer: Self-pay | Admitting: Emergency Medicine

## 2020-01-18 DIAGNOSIS — F1590 Other stimulant use, unspecified, uncomplicated: Secondary | ICD-10-CM | POA: Diagnosis not present

## 2020-01-18 DIAGNOSIS — N3001 Acute cystitis with hematuria: Secondary | ICD-10-CM

## 2020-01-18 DIAGNOSIS — Z87891 Personal history of nicotine dependence: Secondary | ICD-10-CM | POA: Insufficient documentation

## 2020-01-18 DIAGNOSIS — N3091 Cystitis, unspecified with hematuria: Secondary | ICD-10-CM | POA: Insufficient documentation

## 2020-01-18 DIAGNOSIS — R110 Nausea: Secondary | ICD-10-CM | POA: Insufficient documentation

## 2020-01-18 DIAGNOSIS — U071 COVID-19: Secondary | ICD-10-CM | POA: Insufficient documentation

## 2020-01-18 DIAGNOSIS — R0602 Shortness of breath: Secondary | ICD-10-CM | POA: Diagnosis present

## 2020-01-18 DIAGNOSIS — R0789 Other chest pain: Secondary | ICD-10-CM

## 2020-01-18 LAB — COMPREHENSIVE METABOLIC PANEL
ALT: 19 U/L (ref 0–44)
AST: 19 U/L (ref 15–41)
Albumin: 4 g/dL (ref 3.5–5.0)
Alkaline Phosphatase: 56 U/L (ref 38–126)
Anion gap: 10 (ref 5–15)
BUN: 10 mg/dL (ref 6–20)
CO2: 22 mmol/L (ref 22–32)
Calcium: 9.1 mg/dL (ref 8.9–10.3)
Chloride: 104 mmol/L (ref 98–111)
Creatinine, Ser: 0.87 mg/dL (ref 0.44–1.00)
GFR, Estimated: >60 mL/min (ref >60)
Glucose, Bld: 77 mg/dL (ref 70–99)
Potassium: 3.5 mmol/L (ref 3.5–5.1)
Sodium: 136 mmol/L (ref 135–145)
Total Bilirubin: 0.6 mg/dL (ref 0.3–1.2)
Total Protein: 6.9 g/dL (ref 6.5–8.1)

## 2020-01-18 LAB — URINALYSIS, ROUTINE W REFLEX MICROSCOPIC
Bilirubin Urine: NEGATIVE
Glucose, UA: NEGATIVE mg/dL
Ketones, ur: 20 mg/dL — AB
Leukocytes,Ua: NEGATIVE
Nitrite: POSITIVE — AB
Protein, ur: NEGATIVE mg/dL
Specific Gravity, Urine: 1.01 (ref 1.005–1.030)
pH: 6 (ref 5.0–8.0)

## 2020-01-18 LAB — CBC WITH DIFFERENTIAL/PLATELET
Abs Immature Granulocytes: 0.01 10*3/uL (ref 0.00–0.07)
Basophils Absolute: 0 10*3/uL (ref 0.0–0.1)
Basophils Relative: 1 %
Eosinophils Absolute: 0 10*3/uL (ref 0.0–0.5)
Eosinophils Relative: 0 %
HCT: 39.2 % (ref 36.0–46.0)
Hemoglobin: 12.6 g/dL (ref 12.0–15.0)
Immature Granulocytes: 0 %
Lymphocytes Relative: 41 %
Lymphs Abs: 1.8 10*3/uL (ref 0.7–4.0)
MCH: 29.4 pg (ref 26.0–34.0)
MCHC: 32.1 g/dL (ref 30.0–36.0)
MCV: 91.4 fL (ref 80.0–100.0)
Monocytes Absolute: 0.6 10*3/uL (ref 0.1–1.0)
Monocytes Relative: 13 %
Neutro Abs: 2 10*3/uL (ref 1.7–7.7)
Neutrophils Relative %: 45 %
Platelets: 159 10*3/uL (ref 150–400)
RBC: 4.29 MIL/uL (ref 3.87–5.11)
RDW: 13.4 % (ref 11.5–15.5)
WBC: 4.4 10*3/uL (ref 4.0–10.5)
nRBC: 0 % (ref 0.0–0.2)

## 2020-01-18 LAB — TROPONIN I (HIGH SENSITIVITY): Troponin I (High Sensitivity): 3 ng/L (ref <18)

## 2020-01-18 LAB — I-STAT BETA HCG BLOOD, ED (MC, WL, AP ONLY): I-stat hCG, quantitative: <5 m[IU]/mL (ref <5)

## 2020-01-18 MED ORDER — SODIUM CHLORIDE 0.9 % IV SOLN: 1200.0000 mg | Freq: Once | INTRAVENOUS | Status: DC

## 2020-01-18 MED ORDER — DIPHENHYDRAMINE HCL 50 MG/ML IJ SOLN: 50.0000 mg | Freq: Once | INTRAMUSCULAR | Status: DC | PRN

## 2020-01-18 MED ORDER — METHYLPREDNISOLONE SODIUM SUCC 125 MG IJ SOLR: 125.0000 mg | Freq: Once | INTRAMUSCULAR | Status: DC | PRN

## 2020-01-18 MED ORDER — SODIUM CHLORIDE 0.9 % IV SOLN
Freq: Once | INTRAVENOUS | Status: AC
  Filled 2020-01-18: qty 20

## 2020-01-18 MED ORDER — FAMOTIDINE IN NACL 20-0.9 MG/50ML-% IV SOLN: 20.0000 mg | Freq: Once | INTRAVENOUS | Status: DC | PRN

## 2020-01-18 MED ORDER — GUAIFENESIN 100 MG/5ML PO SOLN
10.0000 mL | Freq: Once | ORAL | Status: AC
  Administered 2020-01-18: 16:00:00 200 mg via ORAL
  Filled 2020-01-18: qty 10

## 2020-01-18 MED ORDER — EPINEPHRINE 0.3 MG/0.3ML IJ SOAJ: 0.3000 mg | Freq: Once | INTRAMUSCULAR | Status: DC | PRN

## 2020-01-18 MED ORDER — CEPHALEXIN 500 MG PO CAPS: 500.0000 mg | ORAL_CAPSULE | Freq: Four times a day (QID) | ORAL | 0 refills | Status: AC

## 2020-01-18 MED ORDER — ALBUTEROL SULFATE HFA 108 (90 BASE) MCG/ACT IN AERS: 2.0000 | INHALATION_SPRAY | Freq: Once | RESPIRATORY_TRACT | Status: DC | PRN

## 2020-01-18 MED ORDER — DEXTROMETHORPHAN-GUAIFENESIN 10-100 MG/5ML PO LIQD: 5.0000 mL | Freq: Four times a day (QID) | ORAL | 0 refills | Status: DC | PRN

## 2020-01-18 MED ORDER — SODIUM CHLORIDE 0.9 % IV SOLN: INTRAVENOUS | Status: DC | PRN

## 2020-01-18 MED ORDER — ACETAMINOPHEN 500 MG PO TABS
1000.0000 mg | ORAL_TABLET | Freq: Once | ORAL | Status: AC
  Administered 2020-01-18: 16:00:00 1000 mg via ORAL
  Filled 2020-01-18: qty 2

## 2020-01-18 NOTE — Discharge Instructions (Addendum)
Like we discussed, I prescribed you cough medication called Tussin DM.  You can take this as prescribed for your cough.   I am also prescribing a medication called Keflex.  This will help with your UTI.  Please take this 4 times a day for the next 5 days.  I would recommend you continue taking Tylenol for management of your body aches and fevers.  Do not exceed 3000 mg in 1 day.  If you develop any new or worsening symptoms, please return to the emergency department for reevaluation.  Otherwise, please follow-up with your primary care provider.  It was a pleasure to meet you.

## 2020-01-18 NOTE — ED Triage Notes (Signed)
Patient presents with Chest pain and shortness of breath which began today. [place]. Patient diagnosed with Covid 3 days ago.   EMS vitals: 60 HR 16 Resp Rate 100% SPO2 on room air 102 CBG 136/90 BP

## 2020-01-18 NOTE — ED Provider Notes (Addendum)
[facility]-EMERGENCY DEPT Provider Note   CSN: [id number] Arrival date & time: 01/18/20  1347     History Chief Complaint  Patient presents with  . Shortness of Breath  . Chest Pain  . Covid Positive    Julia Park is a 24 y.o. female.  HPI Patient is a 24 year old female with no significant medical history who presents to the emergency department due to chest tightness and shortness of breath.  Patient started experiencing symptoms consistent with a viral URI about 2 days ago and then was diagnosed with COVID-19 yesterday.  She was discharged home.  She states her chest tightness and shortness of breath started while she was at the mall earlier today.  Patient reports some associated nausea as well as generalized body aches and fevers. No dysuria but states her urine has had more of a "foul smell" to it.  No chest pain, abdominal pain, vomiting, diarrhea, urinary changes.    Past Medical History:  Diagnosis Date  . Medical history non-contributory   . Seasonal allergies     Patient Active Problem List   Diagnosis Date Noted  . Nexplanon insertion 09/15/2015  . Post term pregnancy 09/13/2015    Past Surgical History:  Procedure Laterality Date  . NO PAST SURGERIES       OB History    Gravida  1   Para  1   Term  1   Preterm  0   AB  0   Living  1     SAB  0   IAB  0   Ectopic  0   Multiple  0   Live Births  1           Family History  Family history unknown: Yes    Social History   Tobacco Use  . Smoking status: Former Smoker    Packs/day: 0.50    Types: Cigars, Cigarettes    Quit date: 06/10/2015    Years since quitting: 4.6  . Smokeless tobacco: Never Used  Vaping Use  . Vaping Use: Never used  Substance Use Topics  . Alcohol use: No  . Drug use: Yes    Types: Marijuana    Comment: occ   Home Medications Prior to Admission medications   Medication Sig Start Date End Date Taking? Authorizing Provider   albuterol (VENTOLIN HFA) 108 (90 Base) MCG/ACT inhaler Inhale 1-2 puffs into the lungs every 6 (six) hours as needed for wheezing or shortness of breath. 11/20/19   Wieters, Hallie C, PA-C  naproxen (NAPROSYN) 500 MG tablet Take 1 tablet (500 mg total) by mouth 2 (two) times daily with a meal. Patient not taking: Reported on 01/17/2020 09/20/19   Wallis Bamberg, PA-C  ondansetron (ZOFRAN-ODT) 8 MG disintegrating tablet Take 1 tablet (8 mg total) by mouth every 8 (eight) hours as needed for nausea or vomiting. Patient not taking: Reported on 01/17/2020 09/20/19   Wallis Bamberg, PA-C   Allergies    Patient has no known allergies.  Review of Systems   Review of Systems  All other systems reviewed and are negative. Ten systems reviewed and are negative for acute change, except as noted in the HPI.   Physical Exam Updated Vital Signs BP 96/80   Pulse 64   Temp 98.9 F (37.2 C) (Oral)   Resp 19   SpO2 99%   Physical Exam Vitals and nursing note reviewed.  Constitutional:      General: She is not in acute  distress.    Appearance: Normal appearance. She is well-developed and normal weight. She is not ill-appearing, toxic-appearing or diaphoretic.  HENT:     Head: Normocephalic and atraumatic.     Right Ear: External ear normal.     Left Ear: External ear normal.     Nose: Nose normal.     Mouth/Throat:     Mouth: Mucous membranes are moist.     Pharynx: Oropharynx is clear. No oropharyngeal exudate or posterior oropharyngeal erythema.  Eyes:     Extraocular Movements: Extraocular movements intact.  Cardiovascular:     Rate and Rhythm: Normal rate and regular rhythm.     Pulses: Normal pulses.     Heart sounds: Normal heart sounds. No murmur heard. No friction rub. No gallop.      Comments: Regular rate and rhythm without murmurs, rubs, or gallops. Pulmonary:     Effort: Pulmonary effort is normal. No respiratory distress.     Breath sounds: Normal breath sounds. No stridor. No  decreased breath sounds, wheezing, rhonchi or rales.     Comments: Lungs are clear to auscultation bilaterally. Abdominal:     General: Abdomen is flat.     Tenderness: There is no abdominal tenderness.  Musculoskeletal:        General: Normal range of motion.     Cervical back: Normal range of motion and neck supple. No tenderness.     Right lower leg: No tenderness. No edema.     Left lower leg: No tenderness. No edema.     Comments: No leg swelling.  No calf tenderness.  Skin:    General: Skin is warm and dry.  Neurological:     General: No focal deficit present.     Mental Status: She is alert and oriented to person, place, and time.  Psychiatric:        Mood and Affect: Mood normal.        Behavior: Behavior normal.    ED Results / Procedures / Treatments   Labs (all labs ordered are listed, but only abnormal results are displayed) Labs Reviewed  URINALYSIS, ROUTINE W REFLEX MICROSCOPIC - Abnormal; Notable for the following components:      Result Value   APPearance HAZY (*)    Hgb urine dipstick SMALL (*)    Ketones, ur 20 (*)    Nitrite POSITIVE (*)    Bacteria, UA MANY (*)    All other components within normal limits  COMPREHENSIVE METABOLIC PANEL  CBC WITH DIFFERENTIAL/PLATELET  I-STAT BETA HCG BLOOD, ED ([facility], [facility], [facility] ONLY)  TROPONIN I (HIGH SENSITIVITY)   EKG EKG Interpretation  Date/Time:  Thursday January 18 2020 16:29:15 EST Ventricular Rate:  53 PR Interval:    QRS Duration: 86 QT Interval:  407 QTC Calculation: 383 R Axis:   77 Text Interpretation: Sinus rhythm Borderline short PR interval Confirmed by Geoffery Lyons ([id number]) on 01/18/2020 6:30:24 PM  Radiology DG Chest Portable 1 View  Result Date: 01/18/2020 CLINICAL DATA:  Short of breath, chest tightness, COVID-19 diagnosed 3 days ago EXAM: PORTABLE CHEST 1 VIEW COMPARISON:  07/16/2014 FINDINGS: Single frontal view of the chest demonstrates an unremarkable cardiac silhouette. No airspace disease,  effusion, or pneumothorax. No acute bony abnormalities. IMPRESSION: 1. No acute intrathoracic process. Electronically Signed   By: Sharlet Salina M.D.   On: 01/18/2020 16:57   Procedures Procedures (including critical care time)  Medications Ordered in ED Medications  casirivimab (REGN 10933) 600 mg, imdevimab (REGN 10987) 600  mg in sodium chloride 0.9 % 110 mL IVPB (has no administration in time range)  0.9 %  sodium chloride infusion (has no administration in time range)  diphenhydrAMINE (BENADRYL) injection 50 mg (has no administration in time range)  famotidine (PEPCID) IVPB 20 mg premix (has no administration in time range)  methylPREDNISolone sodium succinate (SOLU-MEDROL) 125 mg/2 mL injection 125 mg (has no administration in time range)  albuterol (VENTOLIN HFA) 108 (90 Base) MCG/ACT inhaler 2 puff (has no administration in time range)  EPINEPHrine (EPI-PEN) injection 0.3 mg (has no administration in time range)  guaiFENesin (ROBITUSSIN) 100 MG/5ML solution 200 mg (200 mg Oral Given 01/18/20 1619)  acetaminophen (TYLENOL) tablet 1,000 mg (1,000 mg Oral Given 01/18/20 1618)   ED Course  I have reviewed the triage vital signs and the nursing notes.  Pertinent labs & imaging results that were available during my care of the patient were reviewed by me and considered in my medical decision making (see chart for details).  Clinical Course as of 01/18/20 1845  Thu Jan 18, 2020  1654 Nitrite(!): POSITIVE [LJ]  1654 Bacteria, UA(!): MANY [LJ]  1710 Troponin I (High Sensitivity): 3 [LJ]    Clinical Course User Index [LJ] Placido Sou, PA-C   MDM Rules/Calculators/A&P                          Patient is a 24 year old female who presents the emergency department with symptoms consistent with COVID-19.  She was diagnosed with COVID-19 yesterday.  She states that today she started experiencing chest tightness as well as shortness of breath while at the mall.  She then contacted EMS  and was brought to the emergency department.  Physical exam is generally reassuring.  Not tachycardic or hypoxic.  Lungs are clear to auscultation bilaterally.  Chest x-ray is negative.  Troponin of 3.  ECG showing sinus rhythm. No leg swelling, hemoptysis, estrogen use.  Given her lack of tachycardia or hypoxia, feel her risk of DVT/PE is low at this time.  Patient is early in COVID-19 course.  UA positive for nitrites and many bacteria.  Given her urinary complaints, will discharge on a course of Keflex.  [facility] infusion center reached out to me and recommended MAB infusion for this patient.  They are closed for the next 3 days and requested that we perform this in the emergency department.  Patient is amenable with this plan.  Feel the patient is safe for discharge at this time.  Patient was discussed with my attending physician.  Will discharge on a course of Tussin DM.  Her questions were answered and she was amicable at the time of discharge.    Final Clinical Impression(s) / ED Diagnoses Final diagnoses:  COVID-19  Chest tightness  Acute cystitis with hematuria   Rx / DC Orders ED Discharge Orders         Ordered    dextromethorphan-guaiFENesin (TUSSIN DM) 10-100 MG/5ML liquid  Every 6 hours PRN        01/18/20 1824    cephALEXin (KEFLEX) 500 MG capsule  4 times daily        01/18/20 1838           Placido Sou, PA-C 01/18/20 1844    Placido Sou, PA-C 01/18/20 1845    Benjiman Core, MD 01/22/20 2246

## 2020-01-18 NOTE — ED Notes (Signed)
MAB infusion started, v/s normal and documented, nurse monitoring pt at bedside, consent form signed, info sheet provided

## 2020-09-09 ENCOUNTER — Encounter (HOSPITAL_COMMUNITY): Payer: Self-pay | Admitting: Emergency Medicine

## 2020-09-09 ENCOUNTER — Ambulatory Visit (HOSPITAL_COMMUNITY)
Admission: EM | Admit: 2020-09-09 | Discharge: 2020-09-09 | Disposition: A | Discharge status: Home / Self Care | Payer: Medicaid Other | Attending: Emergency Medicine | Admitting: Emergency Medicine

## 2020-09-09 ENCOUNTER — Other Ambulatory Visit: Payer: Self-pay

## 2020-09-09 DIAGNOSIS — B349 Viral infection, unspecified: Secondary | ICD-10-CM | POA: Diagnosis not present

## 2020-09-09 DIAGNOSIS — U071 COVID-19: Secondary | ICD-10-CM | POA: Diagnosis not present

## 2020-09-09 LAB — SARS CORONAVIRUS 2 (TAT 6-24 HRS): SARS Coronavirus 2: POSITIVE — AB

## 2020-09-09 NOTE — Discharge Instructions (Addendum)
We will contact you if your COVID test is positive.  It will also appear in your MyChart.   You can take Tylenol and/or Ibuprofen as needed for fever reduction and pain relief.   For cough: honey 1/2 to 1 teaspoon (you can dilute the honey in water or another fluid).  You can also use guaifenesin and dextromethorphan for cough. You can use a humidifier for chest congestion and cough.  If you don't have a humidifier, you can sit in the bathroom with the hot shower running.     For sore throat: try warm salt water gargles, cepacol lozenges, throat spray, warm tea or water with lemon/honey, popsicles or ice, or OTC cold relief medicine for throat discomfort.    For congestion: take a daily anti-histamine like Zyrtec, Claritin, and a oral decongestant, such as pseudoephedrine.  You can also use Flonase 1-2 sprays in each nostril daily.    It is important to stay hydrated: drink plenty of fluids (water, gatorade/powerade/pedialyte, juices, or teas) to keep your throat moisturized and help further relieve irritation/discomfort.   Return or go to the Emergency Department if symptoms worsen or do not improve in the next few days.  

## 2020-09-09 NOTE — ED Provider Notes (Signed)
[facility]    CSN: [id number] Arrival date & time: 09/09/20  1200      History   Chief Complaint Chief Complaint  Patient presents with   Cough   Headache   Nasal Congestion    HPI Julia Park is a 25 y.o. female.   Patient here for evaluation of cough, congestion, and headache that has been ongoing since last Thursday.  Reports recent exposure to COVID.  Reports taking OTC cold/flu medication with some symptom relief.  Denies any trauma, injury, or other precipitating event.  Denies any fevers, chest pain, shortness of breath, N/V/D, numbness, tingling, weakness, abdominal pain, or headaches.     The history is provided by the patient.  Cough Associated symptoms: headaches   Headache Associated symptoms: congestion, cough and fatigue    Past Medical History:  Diagnosis Date   Medical history non-contributory    Seasonal allergies     Patient Active Problem List   Diagnosis Date Noted   Nexplanon insertion 09/15/2015   Post term pregnancy 09/13/2015    Past Surgical History:  Procedure Laterality Date   NO PAST SURGERIES      OB History     Gravida  1   Para  1   Term  1   Preterm  0   AB  0   Living  1      SAB  0   IAB  0   Ectopic  0   Multiple  0   Live Births  1            Home Medications    Prior to Admission medications   Medication Sig Start Date End Date Taking? Authorizing Provider  albuterol (VENTOLIN HFA) 108 (90 Base) MCG/ACT inhaler Inhale 1-2 puffs into the lungs every 6 (six) hours as needed for wheezing or shortness of breath. 11/20/19   Wieters, Hallie C, PA-C  dextromethorphan-guaiFENesin (TUSSIN DM) 10-100 MG/5ML liquid Take 5 mLs by mouth every 6 (six) hours as needed for cough. 01/18/20   Placido Sou, PA-C  naproxen (NAPROSYN) 500 MG tablet Take 1 tablet (500 mg total) by mouth 2 (two) times daily with a meal. Patient not taking: Reported on 01/17/2020 09/20/19   Wallis Bamberg, PA-C   ondansetron (ZOFRAN-ODT) 8 MG disintegrating tablet Take 1 tablet (8 mg total) by mouth every 8 (eight) hours as needed for nausea or vomiting. Patient not taking: Reported on 01/17/2020 09/20/19   Wallis Bamberg, PA-C    Family History Family History  Family history unknown: Yes    Social History Social History   Tobacco Use   Smoking status: Former    Packs/day: 0.50    Types: Cigars, Cigarettes    Quit date: 06/10/2015    Years since quitting: 5.2   Smokeless tobacco: Never  Vaping Use   Vaping Use: Never used  Substance Use Topics   Alcohol use: No   Drug use: Yes    Types: Marijuana    Comment: occ     Allergies   Patient has no known allergies.   Review of Systems Review of Systems  Constitutional:  Positive for fatigue.  HENT:  Positive for congestion.   Respiratory:  Positive for cough.   Neurological:  Positive for headaches.  All other systems reviewed and are negative.   Physical Exam Triage Vital Signs ED Triage Vitals  Enc Vitals Group     BP 09/09/20 1329 (!) 121/58     Pulse Rate 09/09/20 1329  64     Resp 09/09/20 1329 16     Temp 09/09/20 1329 98.1 F (36.7 C)     Temp Source 09/09/20 1329 Oral     SpO2 09/09/20 1329 99 %     Weight --      Height --      Head Circumference --      Peak Flow --      Pain Score 09/09/20 1327 0     Pain Loc --      Pain Edu? --      Excl. in GC? --    No data found.  Updated Vital Signs BP (!) 121/58 (BP Location: Right Arm)   Pulse 64   Temp 98.1 F (36.7 C) (Oral)   Resp 16   LMP 08/20/2020   SpO2 99%   Visual Acuity Right Eye Distance:   Left Eye Distance:   Bilateral Distance:    Right Eye Near:   Left Eye Near:    Bilateral Near:     Physical Exam Vitals and nursing note reviewed.  Constitutional:      General: She is not in acute distress.    Appearance: Normal appearance. She is not ill-appearing, toxic-appearing or diaphoretic.  HENT:     Head: Normocephalic and atraumatic.   Eyes:     Conjunctiva/sclera: Conjunctivae normal.  Cardiovascular:     Rate and Rhythm: Normal rate and regular rhythm.     Pulses: Normal pulses.     Heart sounds: Normal heart sounds.  Pulmonary:     Effort: Pulmonary effort is normal.     Breath sounds: Normal breath sounds.  Abdominal:     General: Abdomen is flat.  Musculoskeletal:        General: Normal range of motion.     Cervical back: Normal range of motion.  Skin:    General: Skin is warm and dry.  Neurological:     General: No focal deficit present.     Mental Status: She is alert and oriented to person, place, and time.  Psychiatric:        Mood and Affect: Mood normal.     UC Treatments / Results  Labs (all labs ordered are listed, but only abnormal results are displayed) Labs Reviewed  SARS CORONAVIRUS 2 (TAT 6-24 HRS)    EKG   Radiology No results found.  Procedures Procedures (including critical care time)  Medications Ordered in UC Medications - No data to display  Initial Impression / Assessment and Plan / UC Course  I have reviewed the triage vital signs and the nursing notes.  Pertinent labs & imaging results that were available during my care of the patient were reviewed by me and considered in my medical decision making (see chart for details).    Assessment negative for red flags or concerns.  COVID test pending.  Discussed current CDC guidelines for quarantine.  Work note given to patient.  Discussed conservative symptom management as described in discharge instructions.  Encouraged fluids and rest.  Follow up as needed.  Final Clinical Impressions(s) / UC Diagnoses   Final diagnoses:  Viral illness     Discharge Instructions      We will contact you if your COVID test is positive.  It will also appear in your MyChart.   You can take Tylenol and/or Ibuprofen as needed for fever reduction and pain relief.   For cough: honey 1/2 to 1 teaspoon (you can dilute the honey in water  or another fluid).  You can also use guaifenesin and dextromethorphan for cough. You can use a humidifier for chest congestion and cough.  If you don't have a humidifier, you can sit in the bathroom with the hot shower running.     For sore throat: try warm salt water gargles, cepacol lozenges, throat spray, warm tea or water with lemon/honey, popsicles or ice, or OTC cold relief medicine for throat discomfort.    For congestion: take a daily anti-histamine like Zyrtec, Claritin, and a oral decongestant, such as pseudoephedrine.  You can also use Flonase 1-2 sprays in each nostril daily.    It is important to stay hydrated: drink plenty of fluids (water, gatorade/powerade/pedialyte, juices, or teas) to keep your throat moisturized and help further relieve irritation/discomfort.   Return or go to the Emergency Department if symptoms worsen or do not improve in the next few days.      ED Prescriptions   None    PDMP not reviewed this encounter.   Ivette Loyal, NP 09/09/20 1349

## 2020-09-09 NOTE — ED Triage Notes (Signed)
Pt presents with headache, runny nose, and couch xs 3-4 days. States has taken cold/flu medication with come relief.

## 2020-09-25 ENCOUNTER — Emergency Department (HOSPITAL_COMMUNITY)
Admission: EM | Admit: 2020-09-25 | Discharge: 2020-09-25 | Disposition: A | Discharge status: Home / Self Care | Payer: Medicaid Other | Attending: Emergency Medicine | Admitting: Emergency Medicine

## 2020-09-25 DIAGNOSIS — L02214 Cutaneous abscess of groin: Secondary | ICD-10-CM | POA: Diagnosis present

## 2020-09-25 DIAGNOSIS — Z87891 Personal history of nicotine dependence: Secondary | ICD-10-CM | POA: Insufficient documentation

## 2020-09-25 DIAGNOSIS — L0291 Cutaneous abscess, unspecified: Secondary | ICD-10-CM

## 2020-09-25 MED ORDER — CEPHALEXIN 500 MG PO CAPS: 500.0000 mg | ORAL_CAPSULE | Freq: Two times a day (BID) | ORAL | 0 refills | Status: AC

## 2020-09-25 MED ORDER — LIDOCAINE-EPINEPHRINE (PF) 2 %-1:200000 IJ SOLN
10.0000 mL | Freq: Once | INTRAMUSCULAR | Status: AC
  Administered 2020-09-25: 10 mL
  Filled 2020-09-25: qty 20

## 2020-09-25 NOTE — ED Provider Notes (Signed)
Emergency Medicine Provider Triage Evaluation Note  Julia Park , a 25 y.o. female  was evaluated in triage.  Pt complains of 2 days of increasing abscess.  Pt has used warm compresses without relief.  Walking makes it worse.  Nothing makes it better.  No hx of immunocompromise or recurrent abscess.  Review of Systems  Positive: abscess Negative: Fever, N/V  Physical Exam  BP 120/69 (BP Location: Left Arm)   Pulse 90   Temp 98.5 F (36.9 C)   Resp 17   SpO2 (!) 83%  Gen:   Awake, no distress  Resp:  Normal effort  MSK:   Moves extremities without difficulty, ambulatory without difficulty Other:  Quarter sized area of fluctuance and induration in the right groin - chaperone present for exam  Medical Decision Making  Medically screening exam initiated at 12:15 AM.  Appropriate orders placed.  Julia Park was informed that the remainder of the evaluation will be completed by another provider, this initial triage assessment does not replace that evaluation, and the importance of remaining in the ED until their evaluation is complete.  Abscess - appears amenable to I&D.   Fairley Copher, Boyd Kerbs 09/25/20 0017    Dione Booze, MD 09/25/20 [phone]

## 2020-09-25 NOTE — ED Notes (Signed)
Lidocaine injection at bedside.

## 2020-09-25 NOTE — ED Notes (Signed)
Pt care taken, complaining of an abscess on the vagina that started 2-3 days ago

## 2020-09-25 NOTE — Discharge Instructions (Addendum)
Warm compress to area. Warm baths. Keep packing in place for 48 hours. Wound check in 2 days. Take the antibiotics as prescribed  Return for new or worsening symptoms

## 2020-09-25 NOTE — ED Provider Notes (Signed)
[facility] EMERGENCY DEPARTMENT Provider Note   CSN: [id number] Arrival date & time: 09/25/20  0002     History Chief Complaint  Patient presents with   Abscess    Julia Park is a 25 y.o. female with no significant past medical history who presents for evaluation of abscess. Began 2 days ago. Located to right groin. Began after shaving. Attempted warm compress without relief. No hx of DM, immunocompromised state. No fever, chills, emesis, abd pain, pelvis, pain, vag dc. Denies chance of preg. Rates pain a 10/10. Denies additional aggrieving or alleviating factors.   [facility] PCP yesterday told to place warm compress and come to ED if not improving.  History obtained from patient and past medical records. No interpretor was used  HPI     Past Medical History:  Diagnosis Date   Medical history non-contributory    Seasonal allergies     Patient Active Problem List   Diagnosis Date Noted   Nexplanon insertion 09/15/2015   Post term pregnancy 09/13/2015    Past Surgical History:  Procedure Laterality Date   NO PAST SURGERIES       OB History     Gravida  1   Para  1   Term  1   Preterm  0   AB  0   Living  1      SAB  0   IAB  0   Ectopic  0   Multiple  0   Live Births  1           Family History  Family history unknown: Yes    Social History   Tobacco Use   Smoking status: Former    Packs/day: 0.50    Types: Cigars, Cigarettes    Quit date: 06/10/2015    Years since quitting: 5.2   Smokeless tobacco: Never  Vaping Use   Vaping Use: Never used  Substance Use Topics   Alcohol use: No   Drug use: Yes    Types: Marijuana    Comment: occ    Home Medications Prior to Admission medications   Medication Sig Start Date End Date Taking? Authorizing Provider  cephALEXin (KEFLEX) 500 MG capsule Take 1 capsule (500 mg total) by mouth 2 (two) times daily for 7 days. 09/25/20 10/02/20 Yes Mike Berntsen A, PA-C  albuterol  (VENTOLIN HFA) 108 (90 Base) MCG/ACT inhaler Inhale 1-2 puffs into the lungs every 6 (six) hours as needed for wheezing or shortness of breath. 11/20/19   Wieters, Hallie C, PA-C  dextromethorphan-guaiFENesin (TUSSIN DM) 10-100 MG/5ML liquid Take 5 mLs by mouth every 6 (six) hours as needed for cough. 01/18/20   Placido Sou, PA-C  naproxen (NAPROSYN) 500 MG tablet Take 1 tablet (500 mg total) by mouth 2 (two) times daily with a meal. Patient not taking: Reported on 01/17/2020 09/20/19   Wallis Bamberg, PA-C  ondansetron (ZOFRAN-ODT) 8 MG disintegrating tablet Take 1 tablet (8 mg total) by mouth every 8 (eight) hours as needed for nausea or vomiting. Patient not taking: Reported on 01/17/2020 09/20/19   Wallis Bamberg, PA-C    Allergies    Patient has no known allergies.  Review of Systems   Review of Systems  Constitutional: Negative.   HENT: Negative.    Respiratory: Negative.    Cardiovascular: Negative.   Gastrointestinal: Negative.   Genitourinary: Negative.   Musculoskeletal: Negative.   Skin:  Positive for wound.  Neurological: Negative.   All other systems reviewed and  are negative.  Physical Exam Updated Vital Signs BP 107/76   Pulse 77   Temp 98.5 F (36.9 C)   Resp 18   SpO2 100%   Physical Exam Vitals and nursing note reviewed. Exam conducted with a chaperone present.  Constitutional:      General: She is not in acute distress.    Appearance: She is well-developed. She is not ill-appearing, toxic-appearing or diaphoretic.  HENT:     Head: Normocephalic and atraumatic.  Eyes:     Pupils: Pupils are equal, round, and reactive to light.  Cardiovascular:     Rate and Rhythm: Normal rate.  Pulmonary:     Effort: No respiratory distress.  Abdominal:     General: Bowel sounds are normal. There is no distension.     Palpations: Abdomen is soft.     Tenderness: There is no abdominal tenderness. There is no guarding or rebound.  Genitourinary:    Labia:        Right:  No tenderness, lesion or injury.        Left: No tenderness or lesion.        Comments: 2 cm area of fluctuance with 5 cm area of induration to right inguinal crease. RN chaperone in room. No labial or bartholin's abscess. Does not extend into perineal area.  Musculoskeletal:        General: Normal range of motion.     Cervical back: Normal range of motion.  Skin:    General: Skin is warm and dry.  Neurological:     General: No focal deficit present.     Mental Status: She is alert.  Psychiatric:        Mood and Affect: Mood normal.    ED Results / Procedures / Treatments   Labs (all labs ordered are listed, but only abnormal results are displayed) Labs Reviewed - No data to display  EKG None  Radiology No results found.  Procedures .Marland KitchenIncision and Drainage  Date/Time: 09/25/2020 7:49 AM Performed by: Linwood Dibbles, PA-C Authorized by: Linwood Dibbles, PA-C   Consent:    Consent obtained:  Verbal   Consent given by:  Patient   Risks discussed:  Bleeding, incomplete drainage, pain and damage to other organs   Alternatives discussed:  No treatment Universal protocol:    Procedure explained and questions answered to patient or proxy's satisfaction: yes     Relevant documents present and verified: yes     Test results available : yes     Imaging studies available: yes     Required blood products, implants, devices, and special equipment available: yes     Site/side marked: yes     Immediately prior to procedure, a time out was called: yes     Patient identity confirmed:  Verbally with patient Location:    Type:  Abscess   Size:  3cm   Location:  Anogenital   Anogenital location: Right inguinal crease. Pre-procedure details:    Skin preparation:  Betadine Anesthesia:    Anesthesia method:  Local infiltration   Local anesthetic:  Lidocaine 1% WITH epi Procedure type:    Complexity:  Complex Procedure details:    Ultrasound guidance: no     Needle  aspiration: no     Incision types:  Single straight   Incision depth:  Subcutaneous   Wound management:  Probed and deloculated, irrigated with saline and extensive cleaning   Drainage:  Purulent   Drainage amount:  Copious  Packing materials:  1/4 in gauze Post-procedure details:    Procedure completion:  Tolerated well, no immediate complications   Medications Ordered in ED Medications  lidocaine-EPINEPHrine (XYLOCAINE W/EPI) 2 %-1:200000 (PF) injection 10 mL (10 mLs Infiltration Given 09/25/20 0750)    ED Course  I have reviewed the triage vital signs and the nursing notes.  Pertinent labs & imaging results that were available during my care of the patient were reviewed by me and considered in my medical decision making (see chart for details).  Here for right inguinal abscess. No systemic sx. Afebrile, non septic, non ill appearing. Began after shaving. No immunocompromised states. Area with 2cm fluctuance and 5 cm induration. Does not extend into labial, perineal area. Low suspicion for foreigner gangrene, deep space infection. See procedure note, tolerated well. Instructed warm compress, Abx , Tylenol for pain. Close FU with PCP/Gyn and return for new or worsening symptoms  The patient has been appropriately medically screened and/or stabilized in the ED. I have low suspicion for any other emergent medical condition which would require further screening, evaluation or treatment in the ED or require inpatient management.  Patient is hemodynamically stable and in no acute distress.  Patient able to ambulate in department prior to ED.  Evaluation does not show acute pathology that would require ongoing or additional emergent interventions while in the emergency department or further inpatient treatment.  I have discussed the diagnosis with the patient and answered all questions.  Pain is been managed while in the emergency department and patient has no further complaints prior to  discharge.  Patient is comfortable with plan discussed in room and is stable for discharge at this time.  I have discussed strict return precautions for returning to the emergency department.  Patient was encouraged to follow-up with PCP/specialist refer to at discharge.     MDM Rules/Calculators/A&P                           Final Clinical Impression(s) / ED Diagnoses Final diagnoses:  Abscess    Rx / DC Orders ED Discharge Orders          Ordered    cephALEXin (KEFLEX) 500 MG capsule  2 times daily        09/25/20 0748             Nishant Schrecengost A, PA-C 09/25/20 0754    Virgina Norfolk, DO 09/25/20 0800

## 2020-09-25 NOTE — ED Triage Notes (Signed)
Pt c/o abscess in groin area, worse since 7pm. Denies pertinent hx, was advised to come to ED if hot compress at home didn't help.

## 2021-02-26 ENCOUNTER — Ambulatory Visit (HOSPITAL_COMMUNITY)
Admission: EM | Admit: 2021-02-26 | Discharge: 2021-02-26 | Disposition: A | Discharge status: Home / Self Care | Payer: Medicaid Other | Attending: Physician Assistant | Admitting: Physician Assistant

## 2021-02-26 ENCOUNTER — Other Ambulatory Visit: Payer: Self-pay

## 2021-02-26 ENCOUNTER — Encounter (HOSPITAL_COMMUNITY): Payer: Self-pay | Admitting: *Deleted

## 2021-02-26 DIAGNOSIS — Z113 Encounter for screening for infections with a predominantly sexual mode of transmission: Secondary | ICD-10-CM | POA: Insufficient documentation

## 2021-02-26 DIAGNOSIS — N898 Other specified noninflammatory disorders of vagina: Secondary | ICD-10-CM | POA: Diagnosis not present

## 2021-02-26 DIAGNOSIS — Z202 Contact with and (suspected) exposure to infections with a predominantly sexual mode of transmission: Secondary | ICD-10-CM | POA: Diagnosis not present

## 2021-02-26 MED ORDER — CEFTRIAXONE SODIUM 500 MG IJ SOLR
INTRAMUSCULAR | Status: AC
  Filled 2021-02-26: qty 500

## 2021-02-26 MED ORDER — LIDOCAINE HCL (PF) 1 % IJ SOLN
INTRAMUSCULAR | Status: AC
  Filled 2021-02-26: qty 2

## 2021-02-26 MED ORDER — CEFTRIAXONE SODIUM 500 MG IJ SOLR
500.0000 mg | Freq: Once | INTRAMUSCULAR | Status: AC
  Administered 2021-02-26: 500 mg via INTRAMUSCULAR

## 2021-02-26 NOTE — Discharge Instructions (Signed)
We treated you for gonorrhea today.  We will contact you if we need to arrange any additional treatment.  Please abstain from sex for a minimum of 1 week.  All partners need to be tested and treated as well.  It is important to use a condom with each sexual encounter.  If you have any worsening symptoms including abdominal pain, pelvic pain, fever, nausea, vomiting you should be seen immediately.

## 2021-02-26 NOTE — ED Provider Notes (Signed)
[place]    CSN: [id number] Arrival date & time: 02/26/21  1604      History   Chief Complaint Chief Complaint  Patient presents with   SEXUALLY TRANSMITTED DISEASE    HPI Julia Park is a 26 y.o. female.   Patient presents today for evaluation treatment of STIs.  She reports that her significant other, with whom she has had unprotected intercourse, reports exposure to gonorrhea.  Patient reports she has developed some vaginal discharge over the past several days which she describes as thick and malodorous.  She denies any pelvic pain but does report some lower abdominal cramping.  She denies any fever, nausea, vomiting.  Denies any recent antibiotic use.  She has not tried any over-the-counter medication for symptom management.  She is confident she is not pregnant as she has a Nexplanon and has had a recent menstrual cycle.  She is interested in receiving treatment for gonorrhea today.   Past Medical History:  Diagnosis Date   Medical history non-contributory    Seasonal allergies     Patient Active Problem List   Diagnosis Date Noted   Nexplanon insertion 09/15/2015   Post term pregnancy 09/13/2015    Past Surgical History:  Procedure Laterality Date   NO PAST SURGERIES      OB History     Gravida  1   Para  1   Term  1   Preterm  0   AB  0   Living  1      SAB  0   IAB  0   Ectopic  0   Multiple  0   Live Births  1            Home Medications    Prior to Admission medications   Medication Sig Start Date End Date Taking? Authorizing Provider  albuterol (VENTOLIN HFA) 108 (90 Base) MCG/ACT inhaler Inhale 1-2 puffs into the lungs every 6 (six) hours as needed for wheezing or shortness of breath. 11/20/19   Wieters, Hallie C, PA-C  naproxen (NAPROSYN) 500 MG tablet Take 1 tablet (500 mg total) by mouth 2 (two) times daily with a meal. Patient not taking: Reported on 01/17/2020 09/20/19   Jaynee Eagles, PA-C  ondansetron  (ZOFRAN-ODT) 8 MG disintegrating tablet Take 1 tablet (8 mg total) by mouth every 8 (eight) hours as needed for nausea or vomiting. Patient not taking: Reported on 01/17/2020 09/20/19   Jaynee Eagles, PA-C    Family History Family History  Family history unknown: Yes    Social History Social History   Tobacco Use   Smoking status: Former    Packs/day: 0.50    Types: Cigars, Cigarettes    Quit date: 06/10/2015    Years since quitting: 5.7   Smokeless tobacco: Never  Vaping Use   Vaping Use: Never used  Substance Use Topics   Alcohol use: No   Drug use: Yes    Types: Marijuana    Comment: occ     Allergies   Patient has no known allergies.   Review of Systems Review of Systems  Constitutional:  Negative for activity change, appetite change, fatigue and fever.  Respiratory:  Negative for cough and shortness of breath.   Cardiovascular:  Negative for chest pain.  Gastrointestinal:  Negative for abdominal pain, diarrhea, nausea and vomiting.  Genitourinary:  Positive for vaginal discharge. Negative for dysuria, frequency, pelvic pain, urgency, vaginal bleeding and vaginal pain.  Neurological:  Negative for dizziness,  light-headedness and headaches.    Physical Exam Triage Vital Signs ED Triage Vitals  Enc Vitals Group     BP 02/26/21 1655 109/67     Pulse Rate 02/26/21 1655 68     Resp 02/26/21 1655 18     Temp 02/26/21 1655 99.2 F (37.3 C)     Temp src --      SpO2 02/26/21 1655 100 %     Weight --      Height --      Head Circumference --      Peak Flow --      Pain Score 02/26/21 1652 3     Pain Loc --      Pain Edu? --      Excl. in GC? --    No data found.  Updated Vital Signs BP 109/67    Pulse 68    Temp 99.2 F (37.3 C)    Resp 18    LMP 02/12/2021    SpO2 100%   Visual Acuity Right Eye Distance:   Left Eye Distance:   Bilateral Distance:    Right Eye Near:   Left Eye Near:    Bilateral Near:     Physical Exam Vitals reviewed.   Constitutional:      General: She is awake. She is not in acute distress.    Appearance: Normal appearance. She is well-developed. She is not ill-appearing.     Comments: Very pleasant female appears stated age in no acute distress  HENT:     Head: Normocephalic and atraumatic.  Cardiovascular:     Rate and Rhythm: Normal rate and regular rhythm.     Heart sounds: Normal heart sounds, S1 normal and S2 normal. No murmur heard. Pulmonary:     Effort: Pulmonary effort is normal.     Breath sounds: Normal breath sounds. No wheezing, rhonchi or rales.     Comments: Clear to auscultation bilaterally Abdominal:     General: Bowel sounds are normal.     Palpations: Abdomen is soft.     Tenderness: There is no abdominal tenderness. There is no right CVA tenderness, left CVA tenderness, guarding or rebound.     Comments: Benign abdominal exam  Genitourinary:    Comments: Exam deferred Psychiatric:        Behavior: Behavior is cooperative.     UC Treatments / Results  Labs (all labs ordered are listed, but only abnormal results are displayed) Labs Reviewed  CERVICOVAGINAL ANCILLARY ONLY    EKG   Radiology No results found.  Procedures Procedures (including critical care time)  Medications Ordered in UC Medications  cefTRIAXone (ROCEPHIN) injection 500 mg (has no administration in time range)    Initial Impression / Assessment and Plan / UC Course  I have reviewed the triage vital signs and the nursing notes.  Pertinent labs & imaging results that were available during my care of the patient were reviewed by me and considered in my medical decision making (see chart for details).     STI swab collected today-results pending.  Offered HIV/hepatitis/syphilis testing but patient declined this.  She was given 500 mg of Rocephin to cover for gonorrhea while in clinic given known exposure.  We will defer additional treatment until STI swab results are obtained.  She was  instructed to abstain from sexual intercourse until she receives results and discussed that she will need to avoid sexual contact for minimum of 7 days after completing treatment and all partners  will need to be tested and treated as well.  Discussed the importance of safe sex practices.  Discussed alarm symptoms that warrant emergent evaluation including abdominal pain, pelvic pain, fever, nausea/vomiting.  Strict return precautions given to which she expressed understanding.  Final Clinical Impressions(s) / UC Diagnoses   Final diagnoses:  Vaginal discharge  Exposure to gonorrhea  Routine screening for STI (sexually transmitted infection)     Discharge Instructions      We treated you for gonorrhea today.  We will contact you if we need to arrange any additional treatment.  Please abstain from sex for a minimum of 1 week.  All partners need to be tested and treated as well.  It is important to use a condom with each sexual encounter.  If you have any worsening symptoms including abdominal pain, pelvic pain, fever, nausea, vomiting you should be seen immediately.     ED Prescriptions   None    PDMP not reviewed this encounter.   Terrilee Croak, PA-C 02/26/21 1712

## 2021-02-26 NOTE — ED Triage Notes (Signed)
Pt presents today for STD check with out Sx;s.

## 2021-02-27 LAB — CERVICOVAGINAL ANCILLARY ONLY
Bacterial Vaginitis (gardnerella): POSITIVE — AB
Candida Glabrata: NEGATIVE
Candida Vaginitis: NEGATIVE
Chlamydia: NEGATIVE
Comment: NEGATIVE
Comment: NEGATIVE
Comment: NEGATIVE
Comment: NEGATIVE
Comment: NEGATIVE
Comment: NORMAL
Neisseria Gonorrhea: NEGATIVE
Trichomonas: NEGATIVE

## 2021-02-28 ENCOUNTER — Telehealth (HOSPITAL_COMMUNITY): Payer: Self-pay | Admitting: Emergency Medicine

## 2021-02-28 MED ORDER — METRONIDAZOLE 500 MG PO TABS: 500.0000 mg | ORAL_TABLET | Freq: Two times a day (BID) | ORAL | 0 refills | Status: DC

## 2021-08-05 IMAGING — DX DG CHEST 1V PORT
1 series · 1 of 1 positions shown · non-contrast
Comparison: 07/16/2014

CLINICAL DATA: Short of breath, chest tightness, [id number] diagnosed
3 days ago

EXAM:
PORTABLE CHEST 1 VIEW

[chest ap]
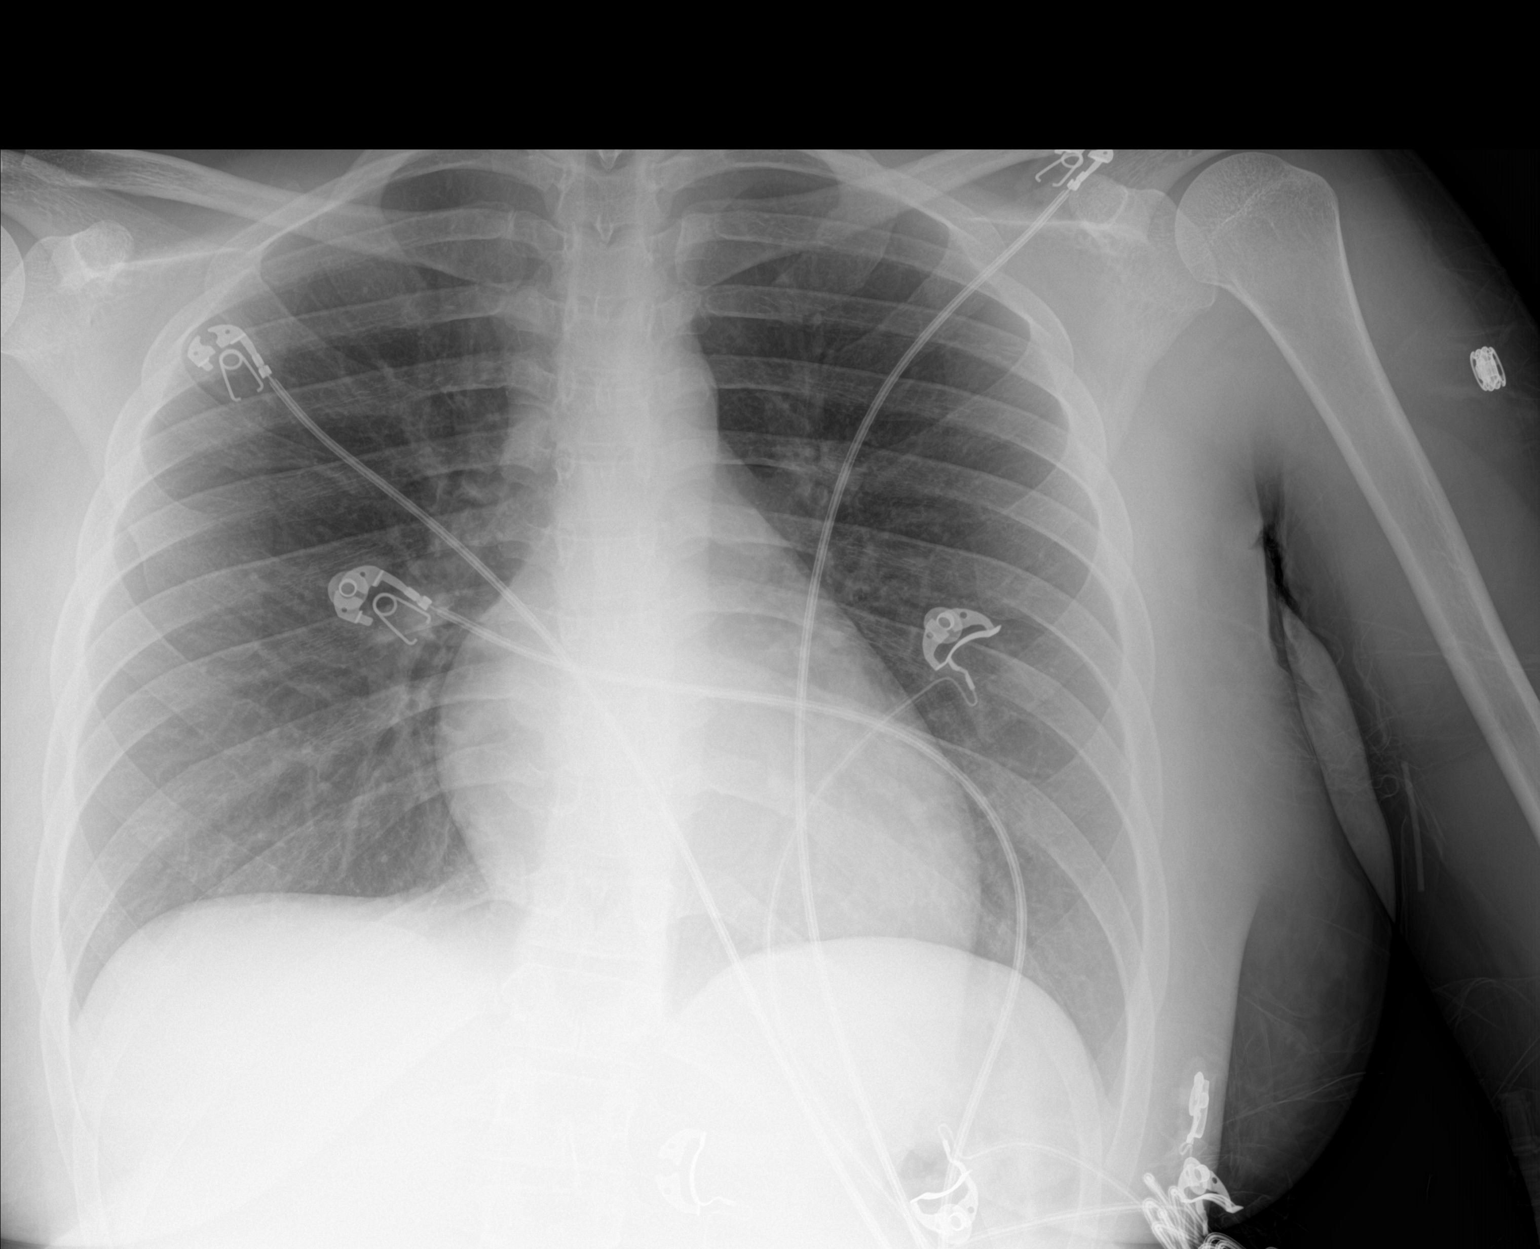

[1 of 1 positions shown; findings below may reference images not displayed]

FINDINGS: Single frontal view of the chest demonstrates an unremarkable
cardiac silhouette. No airspace disease, effusion, or pneumothorax.
No acute bony abnormalities.
IMPRESSION: 1. No acute intrathoracic process.

## 2022-01-02 ENCOUNTER — Ambulatory Visit (HOSPITAL_COMMUNITY): Payer: Medicaid Other

## 2022-01-30 ENCOUNTER — Encounter (HOSPITAL_COMMUNITY): Payer: Self-pay | Admitting: Emergency Medicine

## 2022-01-30 ENCOUNTER — Ambulatory Visit (HOSPITAL_COMMUNITY)
Admission: EM | Admit: 2022-01-30 | Discharge: 2022-01-30 | Disposition: A | Discharge status: Home / Self Care | Payer: Self-pay | Attending: Internal Medicine | Admitting: Internal Medicine

## 2022-01-30 DIAGNOSIS — N3001 Acute cystitis with hematuria: Secondary | ICD-10-CM | POA: Insufficient documentation

## 2022-01-30 DIAGNOSIS — Z1152 Encounter for screening for COVID-19: Secondary | ICD-10-CM | POA: Insufficient documentation

## 2022-01-30 DIAGNOSIS — J069 Acute upper respiratory infection, unspecified: Secondary | ICD-10-CM | POA: Insufficient documentation

## 2022-01-30 DIAGNOSIS — N898 Other specified noninflammatory disorders of vagina: Secondary | ICD-10-CM | POA: Insufficient documentation

## 2022-01-30 DIAGNOSIS — M545 Low back pain, unspecified: Secondary | ICD-10-CM | POA: Insufficient documentation

## 2022-01-30 DIAGNOSIS — R051 Acute cough: Secondary | ICD-10-CM

## 2022-01-30 DIAGNOSIS — J111 Influenza due to unidentified influenza virus with other respiratory manifestations: Secondary | ICD-10-CM

## 2022-01-30 DIAGNOSIS — F17210 Nicotine dependence, cigarettes, uncomplicated: Secondary | ICD-10-CM | POA: Insufficient documentation

## 2022-01-30 DIAGNOSIS — F1290 Cannabis use, unspecified, uncomplicated: Secondary | ICD-10-CM | POA: Insufficient documentation

## 2022-01-30 DIAGNOSIS — R509 Fever, unspecified: Secondary | ICD-10-CM | POA: Insufficient documentation

## 2022-01-30 LAB — POCT URINALYSIS DIPSTICK, ED / UC
Bilirubin Urine: NEGATIVE
Glucose, UA: NEGATIVE mg/dL
Ketones, ur: 80 mg/dL — AB
Leukocytes,Ua: NEGATIVE
Nitrite: POSITIVE — AB
Protein, ur: NEGATIVE mg/dL
Specific Gravity, Urine: 1.025 (ref 1.005–1.030)
Urobilinogen, UA: 1 mg/dL (ref 0.0–1.0)
pH: 6 (ref 5.0–8.0)

## 2022-01-30 MED ORDER — OSELTAMIVIR PHOSPHATE 75 MG PO CAPS: 75.0000 mg | ORAL_CAPSULE | Freq: Two times a day (BID) | ORAL | 0 refills | Status: DC

## 2022-01-30 MED ORDER — IBUPROFEN 800 MG PO TABS
800.0000 mg | ORAL_TABLET | Freq: Once | ORAL | Status: AC
  Administered 2022-01-30: 800 mg via ORAL

## 2022-01-30 MED ORDER — BENZONATATE 100 MG PO CAPS: 100.0000 mg | ORAL_CAPSULE | Freq: Three times a day (TID) | ORAL | 0 refills | Status: DC

## 2022-01-30 MED ORDER — METRONIDAZOLE 500 MG PO TABS: 500.0000 mg | ORAL_TABLET | Freq: Two times a day (BID) | ORAL | 0 refills | Status: DC

## 2022-01-30 MED ORDER — CEPHALEXIN 500 MG PO CAPS: 500.0000 mg | ORAL_CAPSULE | Freq: Two times a day (BID) | ORAL | 0 refills | Status: AC

## 2022-01-30 MED ORDER — IBUPROFEN 800 MG PO TABS
ORAL_TABLET | ORAL | Status: AC
  Filled 2022-01-30: qty 1

## 2022-01-30 NOTE — ED Provider Notes (Signed)
[place]    CSN: [id number] Arrival date & time: 01/30/22  1312      History   Chief Complaint Chief Complaint  Patient presents with   Cough   Back Pain    HPI Julia Park is a 27 y.o. female.   Patient presents urgent care for evaluation of cough, fever/chills, nasal congestion, sore throat, and generalized body aches/fatigue that started yesterday.  Known sick contact with influenza.  She is a smoker (marijuana and cigarettes) but denies other drug use.  No history of chronic respiratory problems like asthma.  She has been using DayQuil for symptoms without relief.  Reports generalized headache as well without blurry vision or dizziness.  Denies ear pain, chest pain, shortness of breath, one-sided weakness, heart palpitations, nausea, vomiting, abdominal pain, diarrhea, constipation, and numbness and paresthesias.  Tolerating food and fluids well without difficulty.  Last dose of DayQuil with Tylenol and it was shortly prior to arrival urgent care.   She would also like to be evaluated for thin white vaginal discharge for the last 1 week as well as strong odor to the urine for the last 1 month.  She has been attempting to treat suspected urinary tract infection with use of cranberry juice unsuccessfully for the last month.  Denies dysuria, urgency, hesitancy, flank pain, and nausea/vomiting.  She does report bilateral lower back pain but denies suprapubic abdominal pain.  Vaginal discharge is thin and white in color.  Vaginal discharge is malodorous.  She has a history of bacterial vaginitis and believes this to be the cause.  She denies recent new sexual partners or known exposure to STD.  No vaginal itching or vaginal rash reported.  She has not attempted use of any over-the-counter medications prior to arrival urgent care for symptoms.  Denies recent history of antibiotic use. Last menstrual cycle was January 14, 2022, she is sexually active with Nexplanon implant.     Cough Back Pain   Past Medical History:  Diagnosis Date   Medical history non-contributory    Seasonal allergies     Patient Active Problem List   Diagnosis Date Noted   Nexplanon insertion 09/15/2015   Post term pregnancy 09/13/2015    Past Surgical History:  Procedure Laterality Date   NO PAST SURGERIES      OB History     Gravida  1   Para  1   Term  1   Preterm  0   AB  0   Living  1      SAB  0   IAB  0   Ectopic  0   Multiple  0   Live Births  1            Home Medications    Prior to Admission medications   Medication Sig Start Date End Date Taking? Authorizing Provider  benzonatate (TESSALON) 100 MG capsule Take 1 capsule (100 mg total) by mouth every 8 (eight) hours. 01/30/22  Yes Talbot Grumbling, FNP  cephALEXin (KEFLEX) 500 MG capsule Take 1 capsule (500 mg total) by mouth 2 (two) times daily for 7 days. 01/30/22 02/06/22 Yes Meliya Mcconahy, Stasia Cavalier, FNP  metroNIDAZOLE (FLAGYL) 500 MG tablet Take 1 tablet (500 mg total) by mouth 2 (two) times daily. 01/30/22  Yes Talbot Grumbling, FNP  oseltamivir (TAMIFLU) 75 MG capsule Take 1 capsule (75 mg total) by mouth every 12 (twelve) hours. 01/30/22  Yes Talbot Grumbling, FNP  albuterol (VENTOLIN HFA) 108 (  90 Base) MCG/ACT inhaler Inhale 1-2 puffs into the lungs every 6 (six) hours as needed for wheezing or shortness of breath. 11/20/19   Wieters, Junius CreamerHallie C, PA-C    Family History Family History  Family history unknown: Yes    Social History Social History   Tobacco Use   Smoking status: Former    Packs/day: 0.50    Types: Cigars, Cigarettes    Quit date: 06/10/2015    Years since quitting: 6.6   Smokeless tobacco: Never  Vaping Use   Vaping Use: Never used  Substance Use Topics   Alcohol use: No   Drug use: Yes    Types: Marijuana    Comment: occ     Allergies   Patient has no known allergies.   Review of Systems Review of Systems  Respiratory:  Positive for  cough.   Musculoskeletal:  Positive for back pain.  Per HPI   Physical Exam Triage Vital Signs ED Triage Vitals [01/30/22 1523]  Enc Vitals Group     BP 118/80     Pulse Rate 98     Resp 16     Temp (!) 102.5 F (39.2 C)     Temp Source Oral     SpO2 98 %     Weight      Height      Head Circumference      Peak Flow      Pain Score      Pain Loc      Pain Edu?      Excl. in GC?    No data found.  Updated Vital Signs BP 118/80 (BP Location: Left Arm)   Pulse 98   Temp (!) 102.5 F (39.2 C) (Oral)   Resp 16   LMP 01/14/2022   SpO2 98%   Visual Acuity Right Eye Distance:   Left Eye Distance:   Bilateral Distance:    Right Eye Near:   Left Eye Near:    Bilateral Near:     Physical Exam Vitals and nursing note reviewed.  Constitutional:      Appearance: She is ill-appearing. She is not toxic-appearing.  HENT:     Head: Normocephalic and atraumatic.     Right Ear: Hearing, tympanic membrane, ear canal and external ear normal.     Left Ear: Hearing, tympanic membrane, ear canal and external ear normal.     Nose: Congestion present.     Mouth/Throat:     Lips: Pink.     Mouth: Mucous membranes are moist.     Pharynx: Posterior oropharyngeal erythema present.     Comments: Mild erythema to the posterior oropharynx. Small amount of clear postnasal drainage visualized to the posterior oropharynx.  Eyes:     General: Lids are normal. Vision grossly intact. Gaze aligned appropriately.        Right eye: No discharge.        Left eye: No discharge.     Extraocular Movements: Extraocular movements intact.     Conjunctiva/sclera: Conjunctivae normal.  Cardiovascular:     Rate and Rhythm: Normal rate and regular rhythm.     Heart sounds: Normal heart sounds, S1 normal and S2 normal.  Pulmonary:     Effort: Pulmonary effort is normal. No respiratory distress.     Breath sounds: Normal breath sounds and air entry.  Musculoskeletal:     Cervical back: Neck supple.      Right lower leg: No edema.     Left  lower leg: No edema.  Lymphadenopathy:     Cervical: Cervical adenopathy present.  Skin:    General: Skin is warm and dry.     Capillary Refill: Capillary refill takes less than 2 seconds.     Findings: No rash.  Neurological:     General: No focal deficit present.     Mental Status: She is alert and oriented to person, place, and time. Mental status is at baseline.     Cranial Nerves: No dysarthria or facial asymmetry.     Motor: No weakness.     Gait: Gait normal.  Psychiatric:        Mood and Affect: Mood normal.        Speech: Speech normal.        Behavior: Behavior normal.        Thought Content: Thought content normal.        Judgment: Judgment normal.      UC Treatments / Results  Labs (all labs ordered are listed, but only abnormal results are displayed) Labs Reviewed  POCT URINALYSIS DIPSTICK, ED / UC - Abnormal; Notable for the following components:      Result Value   Ketones, ur 80 (*)    Hgb urine dipstick MODERATE (*)    Nitrite POSITIVE (*)    All other components within normal limits  SARS CORONAVIRUS 2 (TAT 6-24 HRS)  CERVICOVAGINAL ANCILLARY ONLY    EKG   Radiology No results found.  Procedures Procedures (including critical care time)  Medications Ordered in UC Medications  ibuprofen (ADVIL) tablet 800 mg (800 mg Oral Given 01/30/22 1537)    Initial Impression / Assessment and Plan / UC Course  I have reviewed the triage vital signs and the nursing notes.  Pertinent labs & imaging results that were available during my care of the patient were reviewed by me and considered in my medical decision making (see chart for details).   1.  Influenza-like illness Symptoms and physical exam consistent with a viral upper respiratory tract infection that will likely resolve with rest, fluids, and prescriptions for symptomatic relief. No indication for imaging today based on stable cardiopulmonary exam and  hemodynamically stable vital signs.  COVID-19 testing is pending.  We will call patient if this is positive.  Will initiate Tamiflu today due to presentation and timing of symptoms as well as known sick contact with influenza.  If COVID-19 testing is positive, we will call patient and have her discontinue the Tamiflu.  She is not a candidate for Paxlovid as she is low risk for severe disease.  Patient given ibuprofen 800 mg in clinic today for fever.  Tessalon Perles sent to pharmacy for symptomatic relief to be taken as prescribed. May use ibuprofen/tylenol over the counter for body aches, fever/chills, and overall discomfort associated with viral illness. Nonpharmacologic interventions for symptom relief provided and after visit summary below.   2. Vaginal discharge, acute cystitis with hematuria Symptoms are consistent with acute bacterial vaginitis infection, therefore we will treat with Flagyl twice daily for the next 7 days.  Alcohol abstinence during Flagyl administration discussed, patient voices understanding.  STD testing is pending.  Patient declines HIV and syphilis testing.  Advised to abstain from intercourse until testing comes back and for 7 days if she test positive for an STD to prevent spread of STD to partner.  Urinalysis is nitrite positive consistent with likely urinary tract infection.  Urine culture is pending.  Keflex twice daily for the next  7 days to treat UTI.  Denies allergies to antibiotics.  Advised to increase water intake to at least 64 ounces of water per day to stay well-hydrated. She is to avoid urinary irritants.   Discussed physical exam and available lab work findings in clinic with patient.  Counseled patient regarding appropriate use of medications and potential side effects for all medications recommended or prescribed today. Discussed red flag signs and symptoms of worsening condition,when to call the PCP office, return to urgent care, and when to seek higher  level of care in the emergency department. Patient verbalizes understanding and agreement with plan. All questions answered. Patient discharged in stable condition.    Final Clinical Impressions(s) / UC Diagnoses   Final diagnoses:  Influenza-like illness  Fever, unspecified  Acute cough  Vaginal discharge  Acute cystitis with hematuria     Discharge Instructions      You have a viral upper respiratory infection that is likely influenza.  Take Tamiflu twice daily for the next 5 days. We have tested you for COVID today as well.  If your COVID-19 test is positive, I will call you and have you stop taking Tamiflu.  Purchase mucinex (guaifenesin) 1200mg  and take this every 12 hours for the next few days to thin your nasal congestion and mucous so that you are able to get out of your body easier by coughing and blowing your nose. Drink plenty of water while taking this.  Take tessalon pearles every 8 hours as needed for cough.  You may take tylenol 1,000mg  and ibuprofen 600mg  every 6 hours with food as needed for fever/chills, sore throat, aches/pains, and inflammation associated with viral illness. Take this with food to avoid stomach upset.    Take Flagyl twice daily for the next 7 days.  Do not drink alcohol while taking Flagyl and for 24 to 48 hours after finishing medication as this can cause significant stomach upset and nausea/vomiting.  This will treat your suspected bacterial vaginosis.  You have a urinary tract infection. Take Keflex twice daily for the next 7 days to treat urinary tract infection.  The rest of your STD testing is pending and will come back in the next 2 to 3 days.  Abstain from intercourse until the STD testing comes back to avoid spread of STDs to partner.  We will call you with any positive results, you will not hear from [place] if your results are negative.  You may also see these results on MyChart.  If you develop any new or worsening symptoms, please  return.  If your symptoms are severe, please go to the emergency room.  Follow-up with your primary care provider for further evaluation and management of your symptoms as well as ongoing wellness visits.  I hope you feel better!       ED Prescriptions     Medication Sig Dispense Auth. Provider   oseltamivir (TAMIFLU) 75 MG capsule Take 1 capsule (75 mg total) by mouth every 12 (twelve) hours. 10 capsule Talbot Grumbling, FNP   metroNIDAZOLE (FLAGYL) 500 MG tablet Take 1 tablet (500 mg total) by mouth 2 (two) times daily. 14 tablet Joella Prince M, FNP   benzonatate (TESSALON) 100 MG capsule Take 1 capsule (100 mg total) by mouth every 8 (eight) hours. 21 capsule Joella Prince M, FNP   cephALEXin (KEFLEX) 500 MG capsule Take 1 capsule (500 mg total) by mouth 2 (two) times daily for 7 days. 14 capsule Talbot Grumbling, FNP  PDMP not reviewed this encounter.   Talbot Grumbling, [place] 01/30/22 1659

## 2022-01-30 NOTE — ED Triage Notes (Signed)
Pt reports cough started yesterday. Then back and throat started hurting as well. Congestion and headache as well. Took Nyquil and Dayquil.  Reports last week had odor with urine and vaginal discharge. Denies dysuria.

## 2022-01-30 NOTE — Discharge Instructions (Addendum)
You have a viral upper respiratory infection that is likely influenza.  Take Tamiflu twice daily for the next 5 days. We have tested you for COVID today as well.  If your COVID-19 test is positive, I will call you and have you stop taking Tamiflu.  Purchase mucinex (guaifenesin) 1200mg  and take this every 12 hours for the next few days to thin your nasal congestion and mucous so that you are able to get out of your body easier by coughing and blowing your nose. Drink plenty of water while taking this.  Take tessalon pearles every 8 hours as needed for cough.  You may take tylenol 1,000mg  and ibuprofen 600mg  every 6 hours with food as needed for fever/chills, sore throat, aches/pains, and inflammation associated with viral illness. Take this with food to avoid stomach upset.    Take Flagyl twice daily for the next 7 days.  Do not drink alcohol while taking Flagyl and for 24 to 48 hours after finishing medication as this can cause significant stomach upset and nausea/vomiting.  This will treat your suspected bacterial vaginosis.  You have a urinary tract infection. Take Keflex twice daily for the next 7 days to treat urinary tract infection.  The rest of your STD testing is pending and will come back in the next 2 to 3 days.  Abstain from intercourse until the STD testing comes back to avoid spread of STDs to partner.  We will call you with any positive results, you will not hear from [place] if your results are negative.  You may also see these results on MyChart.  If you develop any new or worsening symptoms, please return.  If your symptoms are severe, please go to the emergency room.  Follow-up with your primary care provider for further evaluation and management of your symptoms as well as ongoing wellness visits.  I hope you feel better!

## 2022-01-31 LAB — SARS CORONAVIRUS 2 (TAT 6-24 HRS): SARS Coronavirus 2: NEGATIVE

## 2022-02-02 LAB — CERVICOVAGINAL ANCILLARY ONLY
Bacterial Vaginitis (gardnerella): POSITIVE — AB
Candida Glabrata: NEGATIVE
Candida Vaginitis: NEGATIVE
Chlamydia: NEGATIVE
Comment: NEGATIVE
Comment: NEGATIVE
Comment: NEGATIVE
Comment: NEGATIVE
Comment: NEGATIVE
Comment: NORMAL
Neisseria Gonorrhea: NEGATIVE
Trichomonas: NEGATIVE

## 2022-08-18 ENCOUNTER — Ambulatory Visit (HOSPITAL_COMMUNITY)
Admission: EM | Admit: 2022-08-18 | Discharge: 2022-08-18 | Disposition: A | Discharge status: Home / Self Care | Payer: Self-pay | Attending: Physician Assistant | Admitting: Physician Assistant

## 2022-08-18 ENCOUNTER — Encounter (HOSPITAL_COMMUNITY): Payer: Self-pay

## 2022-08-18 DIAGNOSIS — L03313 Cellulitis of chest wall: Secondary | ICD-10-CM

## 2022-08-18 MED ORDER — CEPHALEXIN 500 MG PO CAPS: 500.0000 mg | ORAL_CAPSULE | Freq: Four times a day (QID) | ORAL | 0 refills | Status: AC

## 2022-08-18 MED ORDER — IBUPROFEN 600 MG PO TABS: 600.0000 mg | ORAL_TABLET | Freq: Three times a day (TID) | ORAL | 0 refills | Status: AC | PRN

## 2022-08-18 MED ORDER — MUPIROCIN 2 % EX OINT: 1.0000 | TOPICAL_OINTMENT | Freq: Two times a day (BID) | CUTANEOUS | 0 refills | Status: AC

## 2022-08-18 NOTE — Discharge Instructions (Signed)
Start cephalexin 4 times daily for 5 days.  Keep this area clean with soap and water.  Apply Bactroban ointment with dressing changes.  Use warm compress a few times per day.  Take ibuprofen for pain.  Do not take additional NSAIDs with this medication including aspirin, ibuprofen/Advil, naproxen/Aleve.  If you develop any fever, spread of redness, additional pain, drainable fluid collection, nausea, vomiting he should be seen immediately.

## 2022-08-18 NOTE — ED Provider Notes (Signed)
[facility]    CSN: [id number] Arrival date & time: 08/18/22  1623      History   Chief Complaint Chief Complaint  Patient presents with   Recurrent Skin Infections    HPI Julia Park is a 27 y.o. female.   Patient presents today with a weeklong history of painful lesion on her left chest wall near axilla.  She reports that pain is rated 8 on a 0-10 pain scale, described as aching, no aggravating relieving factors identified.  She reports that initially this area was more localized and she was able to drain some purulent drainage.  She initially thought symptoms were improving but then have worsened prompting evaluation.  She denies history of recurrent skin infections including MRSA.  Denies any recent hospitalization or surgical procedure.  Denies any recent antibiotics in the past 90 days.  She denies history of hidradenitis suppurativa.  She denies any fever, nausea, vomiting.  She is confident she is not pregnant.    Past Medical History:  Diagnosis Date   Medical history non-contributory    Seasonal allergies     Patient Active Problem List   Diagnosis Date Noted   Nexplanon insertion 09/15/2015   Post term pregnancy 09/13/2015    Past Surgical History:  Procedure Laterality Date   NO PAST SURGERIES      OB History     Gravida  1   Para  1   Term  1   Preterm  0   AB  0   Living  1      SAB  0   IAB  0   Ectopic  0   Multiple  0   Live Births  1            Home Medications    Prior to Admission medications   Medication Sig Start Date End Date Taking? Authorizing Provider  cephALEXin (KEFLEX) 500 MG capsule Take 1 capsule (500 mg total) by mouth 4 (four) times daily. 08/18/22  Yes Huston Stonehocker K, PA-C  ibuprofen (ADVIL) 600 MG tablet Take 1 tablet (600 mg total) by mouth every 8 (eight) hours as needed. 08/18/22  Yes Nikaela Coyne, Denny Peon K, PA-C  mupirocin ointment (BACTROBAN) 2 % Apply 1 Application topically 2 (two) times daily.  08/18/22  Yes Arnel Wymer K, PA-C  albuterol (VENTOLIN HFA) 108 (90 Base) MCG/ACT inhaler Inhale 1-2 puffs into the lungs every 6 (six) hours as needed for wheezing or shortness of breath. 11/20/19   Wieters, Junius Creamer, PA-C    Family History Family History  Family history unknown: Yes    Social History Social History   Tobacco Use   Smoking status: Former    Current packs/day: 0.00    Types: Cigars, Cigarettes    Quit date: 06/10/2015    Years since quitting: 7.1   Smokeless tobacco: Never  Vaping Use   Vaping status: Never Used  Substance Use Topics   Alcohol use: No   Drug use: Yes    Types: Marijuana    Comment: occ     Allergies   Patient has no known allergies.   Review of Systems Review of Systems  Constitutional:  Positive for activity change. Negative for appetite change, fatigue and fever.  Gastrointestinal:  Negative for abdominal pain, diarrhea, nausea and vomiting.  Musculoskeletal:  Negative for arthralgias and myalgias.  Skin:  Positive for color change. Negative for wound.  Neurological:  Negative for dizziness, light-headedness and headaches.  Physical Exam Triage Vital Signs ED Triage Vitals [08/18/22 1740]  Encounter Vitals Group     BP 131/83     Systolic BP Percentile      Diastolic BP Percentile      Pulse Rate 80     Resp 16     Temp 98.8 F (37.1 C)     Temp Source Oral     SpO2 99 %     Weight      Height      Head Circumference      Peak Flow      Pain Score      Pain Loc      Pain Education      Exclude from Growth Chart    No data found.  Updated Vital Signs BP 131/83 (BP Location: Left Arm)   Pulse 80   Temp 98.8 F (37.1 C) (Oral)   Resp 16   SpO2 99%   Visual Acuity Right Eye Distance:   Left Eye Distance:   Bilateral Distance:    Right Eye Near:   Left Eye Near:    Bilateral Near:     Physical Exam Vitals reviewed.  Constitutional:      General: She is awake. She is not in acute distress.     Appearance: Normal appearance. She is well-developed. She is not ill-appearing.     Comments: Very pleasant female appears stated age in no acute distress sitting in exam room.  HENT:     Head: Normocephalic and atraumatic.  Cardiovascular:     Rate and Rhythm: Normal rate and regular rhythm.     Heart sounds: Normal heart sounds, S1 normal and S2 normal. No murmur heard. Pulmonary:     Effort: Pulmonary effort is normal.     Breath sounds: Normal breath sounds. No wheezing, rhonchi or rales.     Comments: Clear to auscultation bilaterally Skin:    Findings: Erythema present.          Comments: 3 x 1 cm erythematous and indurated lesion noted left chest wall without active bleeding or drainage.  No active wound noted.  No fluctuance on exam.  Psychiatric:        Behavior: Behavior is cooperative.      UC Treatments / Results  Labs (all labs ordered are listed, but only abnormal results are displayed) Labs Reviewed - No data to display  EKG   Radiology No results found.  Procedures Procedures (including critical care time)  Medications Ordered in UC Medications - No data to display  Initial Impression / Assessment and Plan / UC Course  I have reviewed the triage vital signs and the nursing notes.  Pertinent labs & imaging results that were available during my care of the patient were reviewed by me and considered in my medical decision making (see chart for details).     Patient is well-appearing, afebrile, nontoxic, nontachycardic.  No drainable fluid collection that would warrant I&D in clinic today.  Will start cephalexin 4 times daily for 5 days.  Patient has no specific concerns for MRSA or risk factors.  She was encouraged to keep area clean with soap and water and apply Bactroban ointment.  She is also to use warm compresses multiple times per day.  She was given ibuprofen for pain relief and we discussed that she is not to take NSAIDs with this medication due to  risk of GI bleeding but can use Tylenol for breakthrough pain.  If your  symptoms or not improving quickly with antibiotics or if anything worsens she is to return for reevaluation.  Strict return precautions given.  Work excuse note provided.  Final Clinical Impressions(s) / UC Diagnoses   Final diagnoses:  Cellulitis of chest wall     Discharge Instructions      Start cephalexin 4 times daily for 5 days.  Keep this area clean with soap and water.  Apply Bactroban ointment with dressing changes.  Use warm compress a few times per day.  Take ibuprofen for pain.  Do not take additional NSAIDs with this medication including aspirin, ibuprofen/Advil, naproxen/Aleve.  If you develop any fever, spread of redness, additional pain, drainable fluid collection, nausea, vomiting he should be seen immediately.     ED Prescriptions     Medication Sig Dispense Auth. Provider   cephALEXin (KEFLEX) 500 MG capsule Take 1 capsule (500 mg total) by mouth 4 (four) times daily. 20 capsule Jennilee Demarco K, PA-C   mupirocin ointment (BACTROBAN) 2 % Apply 1 Application topically 2 (two) times daily. 22 g Kacie Huxtable K, PA-C   ibuprofen (ADVIL) 600 MG tablet Take 1 tablet (600 mg total) by mouth every 8 (eight) hours as needed. 21 tablet Daniell Mancinas, Noberto Retort, PA-C      PDMP not reviewed this encounter.   Jeani Hawking, PA-C 08/18/22 1827

## 2022-08-18 NOTE — ED Triage Notes (Signed)
Pt presents to office for abscess under left arm pit. Pt states this is a recurrent issue for the patient.
